# Patient Record
Sex: Female | Born: 1960 | Race: White | Hispanic: No | Marital: Married | Smoking: Never smoker
Health system: Southern US, Community
[De-identification: ages and names within clinical notes are randomized; demographics above are authoritative.]

## PROBLEM LIST (undated history)

## (undated) DIAGNOSIS — K219 Gastro-esophageal reflux disease without esophagitis: Secondary | ICD-10-CM

## (undated) DIAGNOSIS — F429 Obsessive-compulsive disorder, unspecified: Secondary | ICD-10-CM

## (undated) DIAGNOSIS — K760 Fatty (change of) liver, not elsewhere classified: Secondary | ICD-10-CM

## (undated) HISTORY — PX: CHOLECYSTECTOMY: SHX55

## (undated) HISTORY — PX: OVARIAN CYST REMOVAL: SHX89

## (undated) HISTORY — PX: APPENDECTOMY: SHX54

## (undated) HISTORY — PX: ABDOMINAL HYSTERECTOMY: SHX81

---

## 1998-03-07 ENCOUNTER — Ambulatory Visit (HOSPITAL_COMMUNITY): Admission: RE | Admit: 1998-03-07 | Discharge: 1998-03-07 | Payer: Self-pay | Admitting: Obstetrics and Gynecology

## 1999-09-22 ENCOUNTER — Ambulatory Visit (HOSPITAL_COMMUNITY): Admission: RE | Admit: 1999-09-22 | Discharge: 1999-09-22 | Payer: Self-pay | Admitting: Family Medicine

## 1999-09-22 ENCOUNTER — Encounter: Payer: Self-pay | Admitting: Family Medicine

## 1999-09-27 ENCOUNTER — Ambulatory Visit (HOSPITAL_COMMUNITY): Admission: RE | Admit: 1999-09-27 | Discharge: 1999-09-27 | Payer: Self-pay | Admitting: Family Medicine

## 1999-09-27 ENCOUNTER — Encounter: Payer: Self-pay | Admitting: Family Medicine

## 1999-09-29 ENCOUNTER — Emergency Department (HOSPITAL_COMMUNITY): Admission: EM | Admit: 1999-09-29 | Discharge: 1999-09-30 | Payer: Self-pay | Admitting: Emergency Medicine

## 1999-12-06 ENCOUNTER — Ambulatory Visit (HOSPITAL_COMMUNITY): Admission: RE | Admit: 1999-12-06 | Discharge: 1999-12-06 | Payer: Self-pay | Admitting: Gastroenterology

## 1999-12-06 ENCOUNTER — Encounter (INDEPENDENT_AMBULATORY_CARE_PROVIDER_SITE_OTHER): Payer: Self-pay | Admitting: Specialist

## 2002-07-05 ENCOUNTER — Encounter (INDEPENDENT_AMBULATORY_CARE_PROVIDER_SITE_OTHER): Payer: Self-pay | Admitting: Specialist

## 2002-07-06 ENCOUNTER — Inpatient Hospital Stay (HOSPITAL_COMMUNITY): Admission: AD | Admit: 2002-07-06 | Discharge: 2002-07-08 | Payer: Self-pay | Admitting: Obstetrics and Gynecology

## 2002-07-25 ENCOUNTER — Inpatient Hospital Stay (HOSPITAL_COMMUNITY): Admission: AD | Admit: 2002-07-25 | Discharge: 2002-07-25 | Payer: Self-pay | Admitting: *Deleted

## 2003-12-11 ENCOUNTER — Other Ambulatory Visit: Admission: RE | Admit: 2003-12-11 | Discharge: 2003-12-11 | Payer: Self-pay | Admitting: Obstetrics and Gynecology

## 2004-12-16 ENCOUNTER — Emergency Department (HOSPITAL_COMMUNITY): Admission: EM | Admit: 2004-12-16 | Discharge: 2004-12-16 | Payer: Self-pay | Admitting: Emergency Medicine

## 2004-12-27 ENCOUNTER — Ambulatory Visit: Payer: Self-pay | Admitting: Gastroenterology

## 2005-02-09 ENCOUNTER — Other Ambulatory Visit: Admission: RE | Admit: 2005-02-09 | Discharge: 2005-02-09 | Payer: Self-pay | Admitting: Obstetrics and Gynecology

## 2006-04-02 ENCOUNTER — Ambulatory Visit: Payer: Self-pay | Admitting: Family Medicine

## 2006-04-02 ENCOUNTER — Inpatient Hospital Stay (HOSPITAL_COMMUNITY): Admission: EM | Admit: 2006-04-02 | Discharge: 2006-04-04 | Payer: Self-pay | Admitting: Emergency Medicine

## 2006-04-11 ENCOUNTER — Ambulatory Visit: Payer: Self-pay | Admitting: Family Medicine

## 2006-08-10 ENCOUNTER — Ambulatory Visit: Payer: Self-pay | Admitting: Family Medicine

## 2006-09-14 DIAGNOSIS — K219 Gastro-esophageal reflux disease without esophagitis: Secondary | ICD-10-CM | POA: Insufficient documentation

## 2006-09-14 DIAGNOSIS — F429 Obsessive-compulsive disorder, unspecified: Secondary | ICD-10-CM | POA: Insufficient documentation

## 2006-11-10 ENCOUNTER — Telehealth (INDEPENDENT_AMBULATORY_CARE_PROVIDER_SITE_OTHER): Payer: Self-pay | Admitting: *Deleted

## 2006-11-10 ENCOUNTER — Ambulatory Visit: Payer: Self-pay | Admitting: Family Medicine

## 2006-11-10 DIAGNOSIS — R519 Headache, unspecified: Secondary | ICD-10-CM | POA: Insufficient documentation

## 2006-11-10 DIAGNOSIS — R51 Headache: Secondary | ICD-10-CM | POA: Insufficient documentation

## 2006-12-17 ENCOUNTER — Emergency Department (HOSPITAL_COMMUNITY): Admission: EM | Admit: 2006-12-17 | Discharge: 2006-12-17 | Payer: Self-pay | Admitting: *Deleted

## 2006-12-19 ENCOUNTER — Emergency Department (HOSPITAL_COMMUNITY): Admission: EM | Admit: 2006-12-19 | Discharge: 2006-12-19 | Payer: Self-pay | Admitting: Emergency Medicine

## 2007-03-09 ENCOUNTER — Telehealth: Payer: Self-pay | Admitting: *Deleted

## 2007-08-06 ENCOUNTER — Ambulatory Visit: Payer: Self-pay | Admitting: Family Medicine

## 2008-02-14 ENCOUNTER — Encounter: Admission: RE | Admit: 2008-02-14 | Discharge: 2008-02-14 | Payer: Self-pay | Admitting: Neurology
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
## 2010-12-03 NOTE — Discharge Summary (Signed)
NAME:  Kiara [name], Kiara [name] NO.:  [id number]   MEDICAL RECORD NO.:  [id number]          PATIENT TYPE:  INP   LOCATION:  3707                         FACILITY:  [facility]   PHYSICIAN:  Lupita Raider, M.D.   DATE OF BIRTH:  14-Mar-1961   DATE OF ADMISSION:  04/02/2006  DATE OF DISCHARGE:  04/04/2006                                 DISCHARGE SUMMARY   ATTENDING PHYSICIAN:  Dr. Oda Cogan of family practice.   PRIMARY CARE PHYSICIAN:  None.   CONSULTS:  Dr. Jacinto Halim of cardiology.   PROCEDURE:  Cardiac cath on September 18 that was totally normal with an  ejection fraction of 60%.   HISTORY OF PRESENT ILLNESS:  This is a 50 year old white female with  extensive family cardiac history (both parents with their first MIs less  than 50) who presented with substernal chest pressure, worsened with  exertion and relieved by rest and nitroglycerin with radiation to her jaw  bilaterally.  Some associated shortness of breath and dizziness.  She does  not smoke, but did work in a bar with heavy second-hand smoke x8 years.  No  cardiac history in herself.  Does have a history of GERD with an EGD and  colonoscopy in 2001 that were both normal.   ADMISSION MEDICATIONS:  1. Luvox 150 mg p.o. daily.  2. MiraLax 17 gm p.o. p.r.n. constipation.  3. Aspirin 81 mg p.o. periodically.   DISCHARGE MEDICATIONS:  1. Luvox 150 mg p.o. daily.  2. MiraLax 17 gm p.o. p.r.n. constipation.  3. Aspirin 81 mg p.o. daily.  Patient instructed to take this daily to      protect her heart.  4. Prilosec OTC 20 mg p.o. daily x14-day trial.   DISCHARGE DIAGNOSES:  1. Noncardiac exertional chest pressure (questionable esophageal spasm).  2. Obsessive-compulsive disorder.  3. Irritable bowel syndrome with constipation predominance.  4. History of GERD with normal EGD and colonoscopy in 2001 by Dr. Danise Edge.  5. Adenomyosis status post hysterectomy.  6. History of migraines.  7. History of  appendectomy.   DISPOSITION:  Patient was discharged home after her cath.  She had a clear  sensorium.  Her calf site dressing was clean, dry and intact.  She still had  chest pressure with exertion.  All vital signs were stable, however, and she  was eating very well.   FOLLOWUP:  Hospital followup with Dr. Oda Cogan on September 25 at 2:00  p.m. for followup on her chest pressure and possibly for referral to Dr.  Laural Benes for a GI consult.   LABS:  Cardiac enzymes were negative x3, she had a normal CBC and  chemistries, cholesterol looked great with a total cholesterol 173,  triglycerides 53, HDL 60, LDL 102.  Her TSH was 2.495.  D-dimer negative.  UDS negative.  Negative chest x-ray.  EKG with inverted T waves V1 and V2  without ST segment changes.  Also showed some left axis deviation.   HOSPITAL COURSE:  Given the patient's strong family history and symptoms  consistent with typical angina with nonspecific EKG  changes, patient was  admitted for MI rule out, she was started on Plavix, aspirin and heparin and  enzymes cycled that were negative x3.  Patient continued to have substernal  chest pressure so cardiology was consulted and felt a cath would be needed,  this was done on September 18 and did not show any coronary artery disease  or aortic stenosis or dissection.  The Plavix and heparin were D/C'd and the  patient was discharged on a trial of Prilosec OTC for question of GERD.  The  etiology of her chest pain given a negative cath is likely GI related  although it is somewhat atypical in that it is brought on with exertion.  I  wonder about any kind of component of anxiety.  I do wonder with her history  of GERD severe enough to warrant an EGD in 2001 if she may have some sort of  stricture now, especially in light of her endorsement of intermittent  dysphagia to solids.  She does not endorse any abnormal weight loss or gain.  I recommended she establish a primary care  physician here in the area and  follow up with Dr. Laural Benes who performed her EGD back in 2001 for further  workup of her GI issues, especially if her chest pressure continues.           ______________________________  Lupita Raider, M.D.     KS/MEDQ  D:  04/04/2006  T:  04/04/2006  Job:  [id number]

## 2010-12-03 NOTE — Op Note (Signed)
NAME:  Kiara [name], Kiara [name]                        ACCOUNT NO.:  [id number]   MEDICAL RECORD NO.:  [id number]                   PATIENT TYPE:  OBV   LOCATION:  9399                                 FACILITY:  [facility]   PHYSICIAN:  Juluis Mire, M.D.                DATE OF BIRTH:  1960/11/15   DATE OF PROCEDURE:  07/05/2002  DATE OF DISCHARGE:                                 OPERATIVE REPORT   PREOPERATIVE DIAGNOSES:  Adenomyosis.   POSTOPERATIVE DIAGNOSES:  Adenomyosis.   OPERATIVE PROCEDURE:  Laparoscopically assisted vaginal hysterectomy.   SURGEON:  Juluis Mire, M.D.   ASSISTANT:  Stann Mainland. Vincente Poli, M.D.   ESTIMATED BLOOD LOSS:  200 cc.   PACKS AND DRAINS:  None.   INTRAOPERATIVE BLOOD PLACED:  None.   COMPLICATIONS:  None.   INDICATIONS:  Noted in history and physical.   PROCEDURE AS FOLLOWS:  The patient taken to the OR and placed in supine  position.  After a satisfactory level of general endotracheal anesthesia was  obtained the patient was placed in the dorsal lithotomy position using Allen  stirrups.  The abdomen, perineum, and vagina were prepped out with Betadine.  Bladder was emptied by in-and-out catheterization.  A Hulka tenaculum was  put in place.  The patient was draped out for surgery.  A subumbilical  incision made with the knife.  The Veress needle was introduced in abdominal  cavity.  Abdomen was insufflated with approximately 4 L of carbon dioxide.  The operating laparoscope was introduced.  There was no evidence of injury  to adjacent organs.  A 5 mm trocar was put in the suprapubic area under  direct visualization.  Uterus was upper limits of normal size, slightly  irregular.  Tubes and ovaries unremarkable.  No active endometriosis noted.  The appendix was surgically absent.  Upper abdomen including liver and tip  of the gallbladder were clear.  Using the plasmakinetic tripolar we were  able to take down the right round ligament and adnexa as well  as the left  round ligament and utero-ovarian pedicle.  With this we had good hemostasis  release of the uterus.   The patient's abdomen was deflated of its carbon dioxide.  The patient's  legs were repositioned.  A weighted speculum was placed in the vaginal  vault.  The Hulka tenaculum then removed.  Cervix was grasped with a Christella Hartigan'  tenaculum.  Cul-de-sac was entered sharply.  Uterosacral ligaments were  clamped, cut, and suture ligated with 0 Vicryl.  Using the clamp, cut, and  tie technique with suture ligatures of 0 Vicryl, the parametrium was  serially separated from the sides of the uterus.  Vesicouterine space was  identified, entered sharply, and retractors put in place.  At this point the  uterus was flipped.  Remaining pedicles were clamped and cut.  Uterus passed  off the operative field.  Held pedicles secured  with free ties of 0 Vicryl.  Uterosacral plication stitch of 0 Vicryl put in place.  Vaginal mucosa  reapproximated with figure-of-eight of 0 Vicryl.  We had good hemostasis.  Urine output was clear and adequate after a Foley had been placed.  Sponge  on sponge stick was placed in the vaginal vault.  The patient's legs were  repositioned.   Abdomen was reinflated of its carbon dioxide.  Vaginal cuff was visualized  and irrigated.  Small areas of bleeding brought under control with the  plasmakinetic tripolar.  The ovarian adnexa were unremarkable.  At this  point in time the abdomen was deinflated of its carbon dioxide, all trocars  removed.  Subumbilical incision closed with interrupted subcuticulars of 4-0  Vicryl.  The suprapubic incision was closed with Steri-Strips.  The sponge  on sponge stick was removed from the vaginal vault.  Sponge, instrument,  needle count reported as correct by circulating nurse x2.  Foley catheter  remained clear at time of closure.  The patient when extubated was  transferred to recovery room in good condition.                                                Juluis Mire, M.D.    JSM/MEDQ  D:  07/05/2002  T:  07/05/2002  Job:  [id number]

## 2010-12-03 NOTE — Cardiovascular Report (Signed)
NAME:  Kiara, [name] NO.:  [id number]   MEDICAL RECORD NO.:  [id number]          PATIENT TYPE:  INP   LOCATION:  3707                         FACILITY:  [facility]   PHYSICIAN:  Cristy Hilts. Jacinto Halim, MD       DATE OF BIRTH:  March 15, 1961   DATE OF PROCEDURE:  04/04/2006  DATE OF DISCHARGE:                              CARDIAC CATHETERIZATION   OPERATION/PROCEDURE:  1. Left ventriculography.  2. Selective left coronary arteriography.  3. Ascending aortogram.  4. Abdominal aortogram.  5. Selective left renal artery and closure of the right femoral artery      access with StarClose.   INDICATIONS:  Kiara [name] is a 50 year old Caucasian female with a strong  family history of premature coronary artery disease and borderline  hyperlipidemia who was admitted to the hospital with chest pain suggestive  of unstable angina.  She was ruled out for myocardial infarction.  Because  of persistent chest discomfort and also mildly abnormal EKG in the form of T  wave inversion in the anterior leads and also chest pain relieved with  nitroglycerin, she was brought to the cardiac catheterization lab to  evaluate the coronary anatomy.   HEMODYNAMIC DATA:  Left ventricular pressure of 111/6  with an end diastolic  pressure 10 mmHg.  The aortic pressure was 106/71 with a mean of 87 mmHg.  There was no pressure gradient across the aortic valve.   Right coronary artery:  The right coronary artery is a large caliber vessel,  a dominant vessel, smooth and normal.   Left main:  Left main is a large caliber vessel, smooth and normal.   Circumflex. The circumflex is a moderate caliber vessel, gives origin to a  large OM-1.  It is smooth and normal.   Left anterior descending.  The LAD is a large caliber vessel. Given origin  to large diagonal 1 and several small diagonals.  It ends just after  wrapping around the apex.   ASCENDING AORTOGRAM:  The ascending aortogram performed for evaluation  of  aortic regurgitation and ascending aortic aneurysm.  Revealed normal  ascending aorta with three aortic valve cusps.  There was no evidence of  aortic dissection or aortic regurgitation.   ABDOMINAL AORTOGRAM:  Abdominal aortogram reveals the presence of two renal  arteries, one on right and one on left.  They were widely patent and normal.   A total of 100 mL of contrast was utilized for diagnostic angiography.   IMPRESSION:  Normal coronary arteries, right dominant circulation with  normal left ventricular systolic function.   RECOMMENDATIONS:  Evaluation for noncardiac cause for chest pain is  indicated, especially evaluation for esophageal spasm should be considered.  She will need continued primary prevention strategy for prevention of  coronary artery disease.  Can be discharged home from cardiac standpoint  with followup with primary care physician.   TECHNIQUE:  Using a 6-French right femoral artery access, a 6-French  multipurpose B-2 catheter was advanced to the ascending aorta with a 0.02  [place] J-wire.  The catheter was gently advanced to the left ventricle. Then  contrast injection of left ventricle was performed both in LAO and RAO  positions.  Catheter was flushed with saline and pulled back into the  ascending aorta.  Pressure gradient across the aorta was small.  The right  coronary artery was selectively engaged and angiography was performed.  Then  the left main coronary artery was selectively engaged and angiography was  performed.  Then the catheter was pulled back into the root of the aorta and  the ascending aortogram was performed.  Then the catheter was pulled back in  the abdominal aorta and abdominal aortogram was performed.  Then the  catheter was pulled out of the body in the usual fashion.  Right femoral  angiography was performed through the arterial access sheath and the access  was closed with  StarClose with excellent hemostasis.  The patient  tolerated  the procedure well.  No immediate complications noted.      Cristy Hilts. Jacinto Halim, MD  Electronically Signed     JRG/MEDQ  D:  04/04/2006  T:  04/05/2006  Job:  [id number]

## 2010-12-03 NOTE — H&P (Signed)
NAME:  Kiara [name], Kiara [name]                        ACCOUNT NO.:  [id number]   MEDICAL RECORD NO.:  [id number]                   PATIENT TYPE:  OBV   LOCATION:  9399                                 FACILITY:  [facility]   PHYSICIAN:  Juluis Mire, M.D.                DATE OF BIRTH:  1960/10/08   DATE OF ADMISSION:  07/05/2002  DATE OF DISCHARGE:                                HISTORY & PHYSICAL   HISTORY OF PRESENT ILLNESS:  The patient is a 50 year old, G3, P2, AB1,  married, white female who presents for laparoscopic-assisted vaginal  hysterectomy.  In relation to the present admission, the patient's cycles  are occurring approximately every two weeks.  She is having problems with  menstrual spotting.  With her flow, she has very heavy cycles and increasing  pain and discomfort.  This is becoming limiting from the patient's  standpoint.  She had a previous diagnostic laparoscopy and hysteroscopy done  in 1999, with findings of pelvic endometriosis and uterine adenomyosis.  We  have done a followup ultrasound that did reveal and apparent left ovarian  cyst.  This has decreased on follow up ultrasound.  The patient is unable to  tolerate hormonal management of her cycles with birth control pills and  because of significant discomfort and flow problems now present for  laparoscopic-assisted vaginal hysterectomy.   ALLERGIES:  SULFA, E-MYCIN and CODEINE.   MEDICATIONS:  Luvox 150 mg a day.   PAST MEDICAL HISTORY:  1. Obsessive compulsive disorder on medications as noted.  2. Usual childhood diseases.   PAST SURGICAL HISTORY:  1. Eye surgery done twice.  2. Cyst removed from her ovary once before.  3. Previous laparoscopy and hysteroscopy as noted.   PAST OBSTETRICAL HISTORY:  Two spontaneous vaginal deliveries and one  miscarriage.   FAMILY HISTORY:  Noncontributory.   SOCIAL HISTORY:  No tobacco or alcohol use.   REVIEW OF SYMPTOMS:  Noncontributory.   PHYSICAL EXAMINATION:   VITAL SIGNS:  Afebrile with stable vital signs.  HEENT:  The patient is normocephalic. Pupils equal round and reactive to  light and accommodation.  Extraocular movements intact.  Sclerae and  conjunctivae clear.  Oropharynx clear.  NECK:  Without thyromegaly.  BREASTS:  No discrete masses.  LUNGS:  Clear.  CARDIAC:  Regular rate and rhythm without murmur, rub or gallop.  ABDOMEN:  Benign with no masses, organomegaly or tenderness.  PELVIC:  Normal external genitalia.  Vaginal mucosa is clear.  Cervix is  unremarkable.  Uterus is normal size, shape and contrast.  Adnexa free of  masses.  EXTREMITIES:  Trace edema.  NEUROLOGIC:  Grossly within normal limits.   IMPRESSION:  Pelvic endometriosis, uterine adenomyosis leading to increasing  pelvic symptomatology.   PLAN:  After discussion of options, which included conservative followup  versus further attempts at hormonal management versus further conservative  therapy, the patient decided to proceed with definitive  therapy in the form  of laparoscopic-assisted vaginal hysterectomy.  The risks of surgery have  been discussed including the risk of infection, the risk of hemorrhage  necessitating transfusion with the risk of AIDS or hepatitis, risk of injury  to adjacent organs including bladder, bowel or ureter and the risk of deep  venous thrombosis and pulmonary embolus.  The patient expressed  understanding of indications and risks.                                               Juluis Mire, M.D.    JSM/MEDQ  D:  07/05/2002  T:  07/05/2002  Job:  [id number]

## 2010-12-03 NOTE — H&P (Signed)
NAME:  Kiara [name], Kiara [name] NO.:  [id number]   MEDICAL RECORD NO.:  [id number]          PATIENT TYPE:  INP   LOCATION:  3707                         FACILITY:  [facility]   PHYSICIAN:  Lupita Raider, M.D.   DATE OF BIRTH:  June 12, 1961   DATE OF ADMISSION:  04/02/2006  DATE OF DISCHARGE:                                HISTORY & PHYSICAL   PRIMARY CARE PHYSICIAN:  None.   OB-GYN PHYSICIAN:  Juluis Mire, M.D.   CHIEF COMPLAINT:  Substernal chest pain for rule out myocardial infarction.   HISTORY OF PRESENT ILLNESS:  This is a 50 year old white female with an  extensive family history of cardiac disease (mother had a myocardial  infarction at 51 and father had myocardial infarction at 3) and obsessive  compulsive disorder in herself who presents with a day-long history of a  complaint of chest pressure.  It began this morning substernally located  with associated shortness of breath brought on by exertion and relieved by  rest and being totally supine.  It was associated with some mild  lightheadedness but no loss of consciousness.  She was seen at [facility]  and given nitroglycerin x 1 with relief completely of the chest pressure  from 6/10 to 0/10.  The relief lasted approximately six minutes and then  slowly returned.  Her EKG showed some nonspecific T-wave changes and left  axis deviation, so she was sent here for further evaluation.  EMS repeated  the nitroglycerin x 1, again with full relief.  She also complained to the  EMS crew of mild jaw pain that tasted like metal and has since resolved.  Nitroglycerin x 1 (this is her third dose) in the emergency department with  morphine has added up to give her relief of 2/10 currently.  She does report  a one month history of intermittent daily palpitations and a three to four  month history of decreased energy with questionable increase of dyspnea on  exertion with extreme walking.  No cardiac history in herself.  She  does not  have a primary care physician.  She is only seen by ob-gyn annually.   REVIEW OF SYSTEMS:  Other than some chronic constipation secondary to  irritable bowel syndrome, she denies any other review of systems including  weight changes, loss of consciousness, dry skin or hair changes or heat or  cold intolerance.   PAST MEDICAL HISTORY:  1. Adenomyosis, status post laparoscopic hysterectomy vaginally in 2003.  2. Pelvic endometriosis.  3. History of obsessive-compulsive disorder.  4. History of gastroesophageal reflux disease, status post an      esophagogastroduodenoscopy in May of 2001 that was normal with normal      flexible sigmoidoscopy.  5. History of migraines.  6. History of multiple eye surgeries (1965 and 1966).  7. Appendectomy.  8. Diagnosis of irritable bowel syndrome in 2001 with chronic      constipation.   ALLERGIES:  SULFA, ERYTHROMYCIN AND CODEINE.   HOME MEDICATIONS:  1. 81 mg aspirin p.o. daily.  2. Luvox 150 mg by mouth q. p.m.  3. MiraLax  17 g by mouth daily.   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  She uses social alcohol only and denies  all illicit drug use.  She lives at home with her husband and son.   FAMILY HISTORY:  Mother had her first myocardial infarction of four at the  age of 61 and subsequently died from lung cancer. Her father had a  myocardial infarction at 65 and is still living but is status post two  stents.  All four of her grandparents were deceased in their 49s secondary  to myocardial infarctions.   PHYSICAL EXAMINATION:  VITAL SIGNS:  Temperature 98.2, heart rate 61 to 85,  respiratory rate 20, blood pressure 95 to 112/55 to 60.  98 to 100% oxygen  saturations on room air.  GENERAL:  This is a pleasant and conversant, awake and alert white female  who appears her stated age in no apparent distress.  HEENT:  Moist mucous membranes.  Pupils equal, round  and reactive to light  and accommodation.  Extraocular movements are intact.  NECK:  Supple, no jugular venous distention or lymphadenopathy or thyroid  enlargement.  CARDIOVASCULAR:  Regular rate and rhythm, no murmurs, rubs or gallops.  PULMONARY:  Clear to auscultation bilaterally without wheezes, rhonchi or  crackles and no work of breathing.  ABDOMEN:  Positive bowel sounds, soft, nontender, nondistended, no  hepatosplenomegaly.  EXTREMITIES:  2+ pulses in all extremities.  No edema.  Brisk capillary  refill in fingertips.  SKIN:  Warm and dry.  Good skin turgor, no lesions.  NEUROLOGIC:  Cranial nerves 2 through 12 are intact.  Strength 5/5 in all  extremities, normal gait.   LABORATORY DATA:  CBC and chemistries totally within normal limits.  CBG  with a pH of 7.476, pCO2 of 32.5 and a bicarb of 23.9.  D-dimer of 0.23.  Urinalysis was negative.  Point of care enzymes x 3 were negative.  Chest x-  ray was negative for any acute pulmonary disease and EKG  did show inverted  T-waves in V1 and V2 but no ST segment changes and was positive for left  axis deviation.   ASSESSMENT AND PLAN:  The patient is a 50 year old white female with an  extensive family history of cardiac disease now presenting with a one day  history of typical chest pain (substernal brought on by exertion, relieved  by rest and nitro) with negative cardiac enzymes x 3 and nonspecific EKG  changes.  1. Chest pressure.  This is typical angina by definition.  A strong family      history and eight years of smoking exposure in her obese body habitus      are her major risk factors.  Her lipids are unknown.  She has no know      history of hypertension.  A cardiac source is a real possibility for      this chest pressure.  We will hold on heparin for now, given her mostly      normal EKG (no ST segment changes) and point of care enzymes that have     been negative x 3.  Will start aspirin 81 mg and  nitroglycerin/morphine      p.r.n.  A beta blocker would be useful, but currently given her heart      rate in the 50s and her lowest blood pressure will hold for now.  Will      follow cardiac enzymes x 2 eight hours apart and repeat an EKG in the      a.m.  If her enzymes are abnormal will cardiology and start heparin.      Also check fasting lipid in the a.m. for risk stratification.  Check a      urine drug screen.  Other causes to consider; (1) thyroid.  Given      history of palpitations, will check her TSH; (2) pulmonary embolus but      with a negative D-dimer, no tachypnea or tachycardia and no hypoxia,      this is unlikely; (3) gastroesophageal reflux disease on Protonix; (4)      anxiety in light of her obsessive-compulsive disorder diagnosis and      parental myocardial infarctions in their 40s.  2. Respiratory alkalosis.  The patient is not tachypneic now but I wonder      if she is intermittently secondary to anxiety.  No hypoxia currently      with a normal bicarb.  She is clinically stable.  Will follow      clinically.  Check a BMET in the a.m. to ensure bicarb is okay.  3. Left axis deviation on EKG.  She does not have a murmur on exam. Her      vitals are stable now.  Will follow her cardiac enzymes.  If negative,      this can be worked up with an echocardiogram as an outpatient.  4. Obsessive-compulsive disorder.  Continue Luvox.  5. Prophylaxis Protonix and SCD's.  6. FENGI.  Half normal saline with 20 of KCl per liter at 75 ml per hour x      10 hours until she taking good p.o., regular diet.   DISPOSITION:  Observation for now for cardiac enzymes.  Further treatment  will depend on these (i.e. cardiology consult and heparin if positive versus  outpatient cardiology if negative.           ______________________________  Lupita Raider, M.D.     KS/MEDQ  D:  04/03/2006  T:  04/03/2006  Job:  [id number]

## 2010-12-03 NOTE — Discharge Summary (Signed)
   NAME:  Kiara [name], Kiara [name]                        ACCOUNT NO.:  [id number]   MEDICAL RECORD NO.:  [id number]                   PATIENT TYPE:  INP   LOCATION:  9125                                 FACILITY:  [facility]   PHYSICIAN:  Juluis Mire, M.D.                DATE OF BIRTH:  Nov 18, 1960   DATE OF ADMISSION:  07/05/2002  DATE OF DISCHARGE:  07/08/2002                                 DISCHARGE SUMMARY   ADMITTING DIAGNOSES:  Adenomyosis.   DISCHARGE DIAGNOSES:  Adenomyosis, pathology pending.   OPERATIVE PROCEDURE:  Laparoscopic assisted vaginal hysterectomy.   HISTORY OF PRESENT ILLNESS:  For complete history and physical, please see  dictated note.   HOSPITAL COURSE:  The patient underwent laparoscopic assisted vaginal  hysterectomy without problems.  Pathology is still pending at the present  time.  That evening after surgery she did spike a fever.  Temperature went  up to 101.7.  She felt achy.  Her examination was unremarkable except for a  few basilar rales.  We felt that it may have been atelectasis versus a viral  syndrome.  Her hemoglobin postoperative was 11.4, white count 9600.  She was  begun on IV Ancef and observed.  Her temperature defervesced without any  further issues.  We observed her through her second postoperative day to  make sure the fever had resolved.  She remained afebrile and asymptomatic.  On her third postoperative day she was afebrile with stable vital signs.  She was tolerating a regular diet and ambulating without difficulty.  Abdomen was soft and nontender.  Bowel sounds were active.  She was passing  flatus.  Incisions were clear.  She voided without difficulty and had no  active vaginal bleeding.   In terms of complications, none were encountered during stay in hospital.  The patient discharged home in stable condition.   DISPOSITION:  Routine postoperative instructions and orders given.  She is  to watch for signs of infection with rising  fever, nausea, vomiting,  increasing abdominal pain, or active vaginal bleeding.  She is to avoid  heavy lifting, vaginal entrance, or driving a care.  Follow-up in the office  will be in one week.  Tylox if needed for pain.                                               Juluis Mire, M.D.    JSM/MEDQ  D:  07/08/2002  T:  07/08/2002  Job:  [id number]

## 2011-05-05 LAB — I-STAT 8, (EC8 V) (CONVERTED LAB)
Chloride: 104
Glucose, Bld: 89
Potassium: 3.8
pH, Ven: 7.389 — ABNORMAL HIGH

## 2011-05-05 LAB — CBC
HCT: 41.7
MCHC: 33.6
MCV: 85.7
Platelets: 218
WBC: 6.8

## 2011-05-05 LAB — DIFFERENTIAL
Eosinophils Absolute: 0.1
Eosinophils Relative: 2
Lymphs Abs: 3
Monocytes Relative: 9

## 2011-05-05 LAB — URINALYSIS, ROUTINE W REFLEX MICROSCOPIC
Hgb urine dipstick: NEGATIVE
Nitrite: NEGATIVE
Protein, ur: NEGATIVE
Urobilinogen, UA: 0.2

## 2011-05-05 LAB — PREGNANCY, URINE: Preg Test, Ur: NEGATIVE

## 2011-05-05 LAB — URINE MICROSCOPIC-ADD ON

## 2012-12-15 ENCOUNTER — Encounter (HOSPITAL_BASED_OUTPATIENT_CLINIC_OR_DEPARTMENT_OTHER): Payer: Self-pay

## 2012-12-15 ENCOUNTER — Emergency Department (HOSPITAL_BASED_OUTPATIENT_CLINIC_OR_DEPARTMENT_OTHER)
Admission: EM | Admit: 2012-12-15 | Discharge: 2012-12-15 | Disposition: A | Discharge status: Home / Self Care | Payer: BC Managed Care – PPO | Attending: Emergency Medicine | Admitting: Emergency Medicine

## 2012-12-15 ENCOUNTER — Emergency Department (HOSPITAL_BASED_OUTPATIENT_CLINIC_OR_DEPARTMENT_OTHER): Payer: BC Managed Care – PPO

## 2012-12-15 DIAGNOSIS — R0602 Shortness of breath: Secondary | ICD-10-CM | POA: Insufficient documentation

## 2012-12-15 DIAGNOSIS — F419 Anxiety disorder, unspecified: Secondary | ICD-10-CM

## 2012-12-15 DIAGNOSIS — Z8719 Personal history of other diseases of the digestive system: Secondary | ICD-10-CM | POA: Insufficient documentation

## 2012-12-15 DIAGNOSIS — R0789 Other chest pain: Secondary | ICD-10-CM

## 2012-12-15 DIAGNOSIS — Z79899 Other long term (current) drug therapy: Secondary | ICD-10-CM | POA: Insufficient documentation

## 2012-12-15 DIAGNOSIS — F429 Obsessive-compulsive disorder, unspecified: Secondary | ICD-10-CM | POA: Insufficient documentation

## 2012-12-15 DIAGNOSIS — F411 Generalized anxiety disorder: Secondary | ICD-10-CM | POA: Insufficient documentation

## 2012-12-15 HISTORY — DX: Gastro-esophageal reflux disease without esophagitis: K21.9

## 2012-12-15 HISTORY — DX: Obsessive-compulsive disorder, unspecified: F42.9

## 2012-12-15 LAB — COMPREHENSIVE METABOLIC PANEL
AST: 30 U/L (ref 0–37)
BUN: 19 mg/dL (ref 6–23)
CO2: 27 mEq/L (ref 19–32)
Chloride: 104 mEq/L (ref 96–112)
Creatinine, Ser: 0.7 mg/dL (ref 0.50–1.10)
GFR calc non Af Amer: >90 mL/min (ref >90)
Total Bilirubin: 0.3 mg/dL (ref 0.3–1.2)

## 2012-12-15 LAB — CBC WITH DIFFERENTIAL/PLATELET
Basophils Absolute: 0 10*3/uL (ref 0.0–0.1)
HCT: 39 % (ref 36.0–46.0)
Hemoglobin: 13.3 g/dL (ref 12.0–15.0)
Lymphocytes Relative: 40 % (ref 12–46)
Monocytes Absolute: 0.6 10*3/uL (ref 0.1–1.0)
Monocytes Relative: 9 % (ref 3–12)
Neutro Abs: 3.3 10*3/uL (ref 1.7–7.7)
RBC: 4.49 MIL/uL (ref 3.87–5.11)
WBC: 6.6 10*3/uL (ref 4.0–10.5)

## 2012-12-15 MED ORDER — ALPRAZOLAM 0.25 MG PO TABS: 0.2500 mg | ORAL_TABLET | Freq: Three times a day (TID) | ORAL | Status: DC | PRN

## 2012-12-15 NOTE — ED Notes (Signed)
Pt states that she has anxiety, chest pain, and shortness of breath since 5/29 at about 0800.  Pt states that she continues to feel "anxious" and she cannot seem to shake that.

## 2012-12-15 NOTE — ED Provider Notes (Signed)
History  This chart was scribed for Geoffery Lyons, MD by Ardelia Mems, ED Scribe. This patient was seen in room MH12/MH12 and the patient's care was started at 6:16 PM.   CSN: [id number]  Arrival date & time 12/15/12  1740     Chief Complaint  Patient presents with  . Chest Pain     The history is provided by the patient. No language interpreter was used.    HPI Comments: Kiara [name] is a 52 y.o. female who presents to the Emergency Department complaining of constant, mild chest pain of 2 days duration. Pt reports also having anxiety. She describes chest pain as discomfort. Pt reports having mild SOB which she contributes to seasonal allergies and not the chest pain. Pt states that she has been having anxiety recently and reports that her doctor just started her on Abiliify. Pt reports one similar less severe episode of current symptoms in 2007, when she had a normal heart cath. Pt states that she works constantly. Pt states that mother and father both had MIs in their early 38's. Pt denies smoking and alcohol use.  Past Medical History  Diagnosis Date  . GERD (gastroesophageal reflux disease)   . OCD (obsessive compulsive disorder)     Past Surgical History  Procedure Laterality Date  . Appendectomy    . Ovarian cyst removal    . Abdominal hysterectomy    . Cholecystectomy      History reviewed. No pertinent family history.  History  Substance Use Topics  . Smoking status: Never Smoker   . Smokeless tobacco: Never Used  . Alcohol Use: No    OB History   Grav Para Term Preterm Abortions TAB SAB Ect Mult Living                  Review of Systems  All other systems reviewed and are negative.    Allergies  Erythromycin and Sulfamethoxazole  Home Medications   Current Outpatient Rx  Name  Route  Sig  Dispense  Refill  . ARIPiprazole (ABILIFY) 5 MG tablet   Oral   Take 2.5 mg by mouth daily.         . fluvoxaMINE (LUVOX) 100 MG tablet   Oral   Take  300 mg by mouth daily. 1 1/2 tablet by mouth once a day         . levothyroxine (SYNTHROID, LEVOTHROID) 25 MCG tablet   Oral   Take 25 mcg by mouth daily before breakfast.           Triage Vitals: BP 153/88  Pulse 81  Temp(Src) 98.5 F (36.9 C) (Oral)  Resp 16  Ht 5\' 8"  (1.727 m)  Wt 180 lb (81.647 kg)  BMI 27.38 kg/m2  SpO2 96%  Physical Exam  Nursing note and vitals reviewed. Constitutional: She is oriented to person, place, and time. She appears well-developed and well-nourished.  She appears slightly anxious.  HENT:  Head: Normocephalic and atraumatic.  Eyes: EOM are normal. Pupils are equal, round, and reactive to light.  Neck: Normal range of motion. No tracheal deviation present.  Cardiovascular: Normal rate, regular rhythm and normal heart sounds.   No murmur heard. Pulmonary/Chest: Effort normal and breath sounds normal. No respiratory distress.  Abdominal: Soft. There is no tenderness.  Musculoskeletal: Normal range of motion. She exhibits no tenderness.  Neurological: She is alert and oriented to person, place, and time.  Skin: Skin is warm. No rash noted.  Psychiatric: She has  a normal mood and affect.    ED Course  Procedures (including critical care time)  DIAGNOSTIC STUDIES: Oxygen Saturation is 96% on RA, adequate by my interpretation.    COORDINATION OF CARE: 6:20 PM- Pt advised of plan for treatment and pt agrees.     Labs Reviewed  COMPREHENSIVE METABOLIC PANEL - Abnormal; Notable for the following:    ALT 49 (*)    All other components within normal limits  CBC WITH DIFFERENTIAL  TROPONIN I   Dg Chest 2 View  12/15/2012   *RADIOLOGY REPORT*  Clinical Data: Chest pain.  Dizziness.  Shortness of breath.  CHEST - 2 VIEW  Comparison:  12/17/2006  Findings:  The heart size and mediastinal contours are within normal limits.  Both lungs are clear.  The visualized skeletal structures are unremarkable.  IMPRESSION: No active cardiopulmonary  disease.   Original Report Authenticated By: Myles Rosenthal, M.D.     No diagnosis found.   Date: 12/16/2012  Rate: 74  Rhythm: normal sinus rhythm  QRS Axis: left  Intervals: normal  ST/T Wave abnormalities: normal  Conduction Disutrbances:none  Narrative Interpretation:   Old EKG Reviewed: unchanged    MDM  The patient presents with anxiety-like symptoms, tightness in the chest for the past two days.  The workup is unremarkable, including ekg, troponin, and chest xray, and she has no risk factors.  I suspect an anxiety etiology.  Her symptoms are atypical and she reports recently starting abilify for psychiatric reasons that has made her feel bad.           I personally performed the services described in this documentation, which was scribed in my presence. The recorded information has been reviewed and is accurate.      Geoffery Lyons, MD 12/16/12 [phone]

## 2014-02-19 ENCOUNTER — Other Ambulatory Visit: Payer: Self-pay | Admitting: Dermatology

## 2014-07-19 ENCOUNTER — Encounter (HOSPITAL_BASED_OUTPATIENT_CLINIC_OR_DEPARTMENT_OTHER): Payer: Self-pay | Admitting: Emergency Medicine

## 2014-07-19 ENCOUNTER — Inpatient Hospital Stay (HOSPITAL_BASED_OUTPATIENT_CLINIC_OR_DEPARTMENT_OTHER)
Admission: EM | Admit: 2014-07-19 | Discharge: 2014-07-20 | DRG: 313 | Disposition: A | Discharge status: Home / Self Care | Payer: BC Managed Care – PPO | Attending: Internal Medicine | Admitting: Internal Medicine

## 2014-07-19 ENCOUNTER — Emergency Department (HOSPITAL_BASED_OUTPATIENT_CLINIC_OR_DEPARTMENT_OTHER): Payer: BC Managed Care – PPO

## 2014-07-19 DIAGNOSIS — Z882 Allergy status to sulfonamides status: Secondary | ICD-10-CM

## 2014-07-19 DIAGNOSIS — Z881 Allergy status to other antibiotic agents status: Secondary | ICD-10-CM

## 2014-07-19 DIAGNOSIS — Z79899 Other long term (current) drug therapy: Secondary | ICD-10-CM

## 2014-07-19 DIAGNOSIS — E039 Hypothyroidism, unspecified: Secondary | ICD-10-CM | POA: Diagnosis present

## 2014-07-19 DIAGNOSIS — K219 Gastro-esophageal reflux disease without esophagitis: Secondary | ICD-10-CM | POA: Diagnosis present

## 2014-07-19 DIAGNOSIS — F429 Obsessive-compulsive disorder, unspecified: Secondary | ICD-10-CM | POA: Diagnosis present

## 2014-07-19 DIAGNOSIS — R079 Chest pain, unspecified: Principal | ICD-10-CM | POA: Diagnosis present

## 2014-07-19 DIAGNOSIS — F42 Obsessive-compulsive disorder: Secondary | ICD-10-CM | POA: Diagnosis present

## 2014-07-19 DIAGNOSIS — Z90710 Acquired absence of both cervix and uterus: Secondary | ICD-10-CM

## 2014-07-19 DIAGNOSIS — R42 Dizziness and giddiness: Secondary | ICD-10-CM

## 2014-07-19 LAB — COMPREHENSIVE METABOLIC PANEL
ALK PHOS: 88 U/L (ref 39–117)
ALT: 44 U/L — ABNORMAL HIGH (ref 0–35)
ANION GAP: 7 (ref 5–15)
AST: 41 U/L — ABNORMAL HIGH (ref 0–37)
Albumin: 4.2 g/dL (ref 3.5–5.2)
BILIRUBIN TOTAL: 0.9 mg/dL (ref 0.3–1.2)
BUN: 14 mg/dL (ref 6–23)
CHLORIDE: 103 meq/L (ref 96–112)
CO2: 26 mmol/L (ref 19–32)
CREATININE: 0.74 mg/dL (ref 0.50–1.10)
Calcium: 9 mg/dL (ref 8.4–10.5)
GLUCOSE: 97 mg/dL (ref 70–99)
POTASSIUM: 4.1 mmol/L (ref 3.5–5.1)
Sodium: 136 mmol/L (ref 135–145)
Total Protein: 6.8 g/dL (ref 6.0–8.3)

## 2014-07-19 LAB — CBC WITH DIFFERENTIAL/PLATELET
Basophils Absolute: 0 10*3/uL (ref 0.0–0.1)
Basophils Relative: 0 % (ref 0–1)
Eosinophils Absolute: 0.1 10*3/uL (ref 0.0–0.7)
Eosinophils Relative: 2 % (ref 0–5)
HEMATOCRIT: 39.7 % (ref 36.0–46.0)
HEMOGLOBIN: 13.2 g/dL (ref 12.0–15.0)
LYMPHS ABS: 3 10*3/uL (ref 0.7–4.0)
Lymphocytes Relative: 41 % (ref 12–46)
MCH: 28.9 pg (ref 26.0–34.0)
MCHC: 33.2 g/dL (ref 30.0–36.0)
MCV: 87.1 fL (ref 78.0–100.0)
MONO ABS: 0.6 10*3/uL (ref 0.1–1.0)
MONOS PCT: 9 % (ref 3–12)
NEUTROS ABS: 3.5 10*3/uL (ref 1.7–7.7)
NEUTROS PCT: 48 % (ref 43–77)
Platelets: 201 10*3/uL (ref 150–400)
RBC: 4.56 MIL/uL (ref 3.87–5.11)
RDW: 12.6 % (ref 11.5–15.5)
WBC: 7.3 10*3/uL (ref 4.0–10.5)

## 2014-07-19 LAB — TROPONIN I

## 2014-07-19 LAB — LIPASE, BLOOD: Lipase: 45 U/L (ref 11–59)

## 2014-07-19 MED ORDER — ASPIRIN 81 MG PO CHEW
324.0000 mg | CHEWABLE_TABLET | Freq: Once | ORAL | Status: AC
  Administered 2014-07-19: 324 mg via ORAL
  Filled 2014-07-19: qty 4

## 2014-07-19 NOTE — ED Notes (Signed)
Pt reports pain radiating across chest to neck and jaw

## 2014-07-19 NOTE — ED Provider Notes (Signed)
CSN: [id number]     Arrival date & time 07/19/14  2219 History  This chart was scribed for Kiara Gaskins, MD by Modena Jansky, ED Scribe. This patient was seen in room MH04/MH04 and the patient's care was started at 11:39 PM.   Chief Complaint  Patient presents with  . Chest Pain   Patient is a 54 y.o. female presenting with chest pain. The history is provided by the patient. No language interpreter was used.  Chest Pain Pain quality comment:  Heavy Pain radiates to:  L jaw, R jaw, upper back and neck Pain radiates to the back: yes   Pain severity:  Moderate Duration:  3 days Timing:  Intermittent Progression:  Worsening Chronicity:  Recurrent Relieved by:  None tried Worsened by:  Exertion Ineffective treatments:  None tried Associated symptoms: dizziness, headache, shortness of breath and weakness   Associated symptoms: no abdominal pain, no cough, no fever, no numbness and not vomiting    HPI Comments: Kiara [name] is a 54 y.o. female who presents to the Emergency Department complaining of intermittent moderate chest pain that started a couple of day ago. She reports that this pain has been going on intermittently for a while, but has recently worsened. She states that the pain radiates to her jaw, upper back, and neck. She states that the episodes of pain last 5-10 minutes. She describes the pain as a heavy sensation. She reports that activity exacerbates the pain. She reports that when the pain starts she has SOB, weakness, and dizziness. She states that one episode she had a headache, and another she almost passed out. She reports that she has a family hx of heart disease. She denies any fever, vomiting, cough, abdominal pain, numbness, or leg swelling.   Fam hx - premature CAD   Past Medical History  Diagnosis Date  . GERD (gastroesophageal reflux disease)   . OCD (obsessive compulsive disorder)    Past Surgical History  Procedure Laterality Date  . Appendectomy    .  Ovarian cyst removal    . Abdominal hysterectomy    . Cholecystectomy     History reviewed. No pertinent family history. History  Substance Use Topics  . Smoking status: Never Smoker   . Smokeless tobacco: Never Used  . Alcohol Use: No   OB History    No data available     Review of Systems  Constitutional: Negative for fever.  Respiratory: Positive for shortness of breath. Negative for cough.   Cardiovascular: Positive for chest pain. Negative for leg swelling.  Gastrointestinal: Negative for vomiting and abdominal pain.  Neurological: Positive for dizziness, weakness and headaches. Negative for numbness.  All other systems reviewed and are negative.   Allergies  Erythromycin and Sulfamethoxazole  Home Medications   Prior to Admission medications   Medication Sig Start Date End Date Taking? Authorizing Provider  ALPRAZolam (XANAX) 0.25 MG tablet Take 1 tablet (0.25 mg total) by mouth 3 (three) times daily as needed for sleep. 12/15/12   Geoffery Lyons, MD  ARIPiprazole (ABILIFY) 5 MG tablet Take 2.5 mg by mouth daily.    Historical Provider, MD  fluvoxaMINE (LUVOX) 100 MG tablet Take 300 mg by mouth daily. 1 1/2 tablet by mouth once a day    Historical Provider, MD  levothyroxine (SYNTHROID, LEVOTHROID) 25 MCG tablet Take 25 mcg by mouth daily before breakfast.    Historical Provider, MD   BP 146/76 mmHg  Pulse 75  Temp(Src) 97.6 F (36.4 C) (  Oral)  Resp 20  Ht  (1.727 m)  Wt 180 lb (81.647 kg)  BMI 27.38 kg/m2  SpO2 100% Physical Exam  Nursing note and vitals reviewed. CONSTITUTIONAL: Well developed/well nourished HEAD: Normocephalic/atraumatic EYES: EOMI/PERRL ENMT: Mucous membranes moist NECK: supple no meningeal signs SPINE/BACK:entire spine nontender CV: S1/S2 noted, no murmurs/rubs/gallops noted LUNGS: Lungs are clear to auscultation bilaterally, no apparent distress ABDOMEN: soft, nontender, no rebound or guarding, bowel sounds noted throughout  abdomen GU:no cva tenderness NEURO: Pt is awake/alert/appropriate, moves all extremitiesx4.  No facial droop.   EXTREMITIES: pulses normal/equal, full ROM SKIN: warm, color normal PSYCH: no abnormalities of mood noted, alert and oriented to situation  ED Course  Procedures  DIAGNOSTIC STUDIES: Oxygen Saturation is 100% on RA, normal by my interpretation.    COORDINATION OF CARE: 11:43 PM- Pt advised of plan for treatment which includes medication, radiology, and labs and pt agrees.  12:36 AM PT CP free at this time She has concerning story for possible ACS Will admit D/w dr Clyde Lundborg, [facility] hospital  Labs Review Labs Reviewed  COMPREHENSIVE METABOLIC PANEL - Abnormal; Notable for the following:    AST 41 (*)    ALT 44 (*)    All other components within normal limits  CBC WITH DIFFERENTIAL  TROPONIN I  LIPASE, BLOOD    Imaging Review Dg Chest 2 View  07/19/2014   CLINICAL DATA:  Pain radiating across the Chest to the neck and jaw all day.  EXAM: CHEST  2 VIEW  COMPARISON:  12/15/2012  FINDINGS: The heart size and mediastinal contours are within normal limits. Both lungs are clear. The visualized skeletal structures are unremarkable.  IMPRESSION: No active cardiopulmonary disease.   Electronically Signed   By: Burman Nieves M.D.   On: 07/19/2014 23:33     EKG Interpretation   Date/Time:  Saturday July 19 2014 22:27:38 EST Ventricular Rate:  79 PR Interval:  132 QRS Duration: 80 QT Interval:  384 QTC Calculation: 440 R Axis:   -41 Text Interpretation:  Normal sinus rhythm with sinus arrhythmia Left axis  deviation Possible Anterolateral infarct , age undetermined Abnormal ECG  significant artifact, needs repeat Confirmed by Bebe Shaggy  MD, Shelvia Fojtik  [phone]) on 07/19/2014 11:38:24 PM     Medications  nitroGLYCERIN (NITROGLYN) 2 % ointment 1 inch (not administered)  aspirin chewable tablet 324 mg (324 mg Oral Given 07/19/14 2347)    MDM   Final  diagnoses:  Chest pain  Chest pain, rule out acute myocardial infarction    Nursing notes including past medical history and social history reviewed and considered in documentation xrays/imaging reviewed by myself and considered during evaluation Labs/vital reviewed myself and considered during evaluation Previous records reviewed and considered   I personally performed the services described in this documentation, which was scribed in my presence. The recorded information has been reviewed and is accurate.       Kiara Gaskins, MD 07/20/14 [phone]

## 2014-07-20 ENCOUNTER — Other Ambulatory Visit: Payer: Self-pay

## 2014-07-20 ENCOUNTER — Inpatient Hospital Stay (HOSPITAL_COMMUNITY): Payer: BC Managed Care – PPO

## 2014-07-20 ENCOUNTER — Encounter (HOSPITAL_COMMUNITY): Payer: Self-pay | Admitting: *Deleted

## 2014-07-20 DIAGNOSIS — R0789 Other chest pain: Secondary | ICD-10-CM

## 2014-07-20 DIAGNOSIS — Z881 Allergy status to other antibiotic agents status: Secondary | ICD-10-CM | POA: Diagnosis not present

## 2014-07-20 DIAGNOSIS — R079 Chest pain, unspecified: Secondary | ICD-10-CM | POA: Diagnosis present

## 2014-07-20 DIAGNOSIS — E039 Hypothyroidism, unspecified: Secondary | ICD-10-CM | POA: Diagnosis present

## 2014-07-20 DIAGNOSIS — K210 Gastro-esophageal reflux disease with esophagitis: Secondary | ICD-10-CM

## 2014-07-20 DIAGNOSIS — Z90710 Acquired absence of both cervix and uterus: Secondary | ICD-10-CM | POA: Diagnosis not present

## 2014-07-20 DIAGNOSIS — K219 Gastro-esophageal reflux disease without esophagitis: Secondary | ICD-10-CM | POA: Diagnosis not present

## 2014-07-20 DIAGNOSIS — Z79899 Other long term (current) drug therapy: Secondary | ICD-10-CM | POA: Diagnosis not present

## 2014-07-20 DIAGNOSIS — F42 Obsessive-compulsive disorder: Secondary | ICD-10-CM

## 2014-07-20 DIAGNOSIS — Z882 Allergy status to sulfonamides status: Secondary | ICD-10-CM | POA: Diagnosis not present

## 2014-07-20 LAB — CBC
HEMATOCRIT: 39.7 % (ref 36.0–46.0)
Hemoglobin: 12.9 g/dL (ref 12.0–15.0)
MCH: 28.1 pg (ref 26.0–34.0)
MCHC: 32.5 g/dL (ref 30.0–36.0)
MCV: 86.5 fL (ref 78.0–100.0)
Platelets: 176 10*3/uL (ref 150–400)
RBC: 4.59 MIL/uL (ref 3.87–5.11)
RDW: 12.8 % (ref 11.5–15.5)
WBC: 6.5 10*3/uL (ref 4.0–10.5)

## 2014-07-20 LAB — BASIC METABOLIC PANEL
ANION GAP: 4 — AB (ref 5–15)
BUN: 9 mg/dL (ref 6–23)
CHLORIDE: 106 meq/L (ref 96–112)
CO2: 29 mmol/L (ref 19–32)
Calcium: 9.2 mg/dL (ref 8.4–10.5)
Creatinine, Ser: 0.69 mg/dL (ref 0.50–1.10)
GFR calc non Af Amer: >90 mL/min (ref >90)
GLUCOSE: 102 mg/dL — AB (ref 70–99)
Potassium: 3.6 mmol/L (ref 3.5–5.1)
Sodium: 139 mmol/L (ref 135–145)

## 2014-07-20 LAB — LIPID PANEL
CHOL/HDL RATIO: 2.5 ratio
Cholesterol: 200 mg/dL (ref 0–200)
HDL: 79 mg/dL (ref >39)
LDL Cholesterol: 107 mg/dL — ABNORMAL HIGH (ref 0–99)
TRIGLYCERIDES: 69 mg/dL (ref <150)
VLDL: 14 mg/dL (ref 0–40)

## 2014-07-20 LAB — GLUCOSE, CAPILLARY: GLUCOSE-CAPILLARY: 96 mg/dL (ref 70–99)

## 2014-07-20 LAB — TROPONIN I
Troponin I: <0.03 ng/mL (ref <0.031)
Troponin I: <0.03 ng/mL (ref <0.031)

## 2014-07-20 LAB — PROTIME-INR
INR: 0.91 (ref 0.00–1.49)
Prothrombin Time: 12.4 seconds (ref 11.6–15.2)

## 2014-07-20 LAB — TSH: TSH: 5.393 u[IU]/mL — ABNORMAL HIGH (ref 0.350–4.500)

## 2014-07-20 LAB — HEMOGLOBIN A1C
HEMOGLOBIN A1C: 5.6 % (ref <5.7)
Mean Plasma Glucose: 114 mg/dL (ref <117)

## 2014-07-20 MED ORDER — ARIPIPRAZOLE 5 MG PO TABS
2.5000 mg | ORAL_TABLET | Freq: Every day | ORAL | Status: DC
  Filled 2014-07-20: qty 1

## 2014-07-20 MED ORDER — SODIUM CHLORIDE 0.9 % IV SOLN
INTRAVENOUS | Status: DC
  Administered 2014-07-20: 100 mL/h via INTRAVENOUS

## 2014-07-20 MED ORDER — ACETAMINOPHEN 325 MG PO TABS: 650.0000 mg | ORAL_TABLET | Freq: Four times a day (QID) | ORAL | Status: DC | PRN

## 2014-07-20 MED ORDER — CARVEDILOL 3.125 MG PO TABS: 3.1250 mg | ORAL_TABLET | Freq: Two times a day (BID) | ORAL | Status: DC

## 2014-07-20 MED ORDER — PANTOPRAZOLE SODIUM 40 MG PO TBEC
40.0000 mg | DELAYED_RELEASE_TABLET | Freq: Every day | ORAL | Status: DC
  Administered 2014-07-20: 40 mg via ORAL
  Filled 2014-07-20: qty 1

## 2014-07-20 MED ORDER — LEVOTHYROXINE SODIUM 25 MCG PO TABS
25.0000 ug | ORAL_TABLET | Freq: Every day | ORAL | Status: DC
  Administered 2014-07-20: 25 ug via ORAL
  Filled 2014-07-20: qty 1

## 2014-07-20 MED ORDER — ONDANSETRON HCL 4 MG PO TABS: 4.0000 mg | ORAL_TABLET | Freq: Four times a day (QID) | ORAL | Status: DC | PRN

## 2014-07-20 MED ORDER — HEPARIN SODIUM (PORCINE) 5000 UNIT/ML IJ SOLN
5000.0000 [IU] | Freq: Three times a day (TID) | INTRAMUSCULAR | Status: DC
  Administered 2014-07-20: 5000 [IU] via SUBCUTANEOUS
  Filled 2014-07-20: qty 1

## 2014-07-20 MED ORDER — SODIUM CHLORIDE 0.9 % IJ SOLN
3.0000 mL | Freq: Two times a day (BID) | INTRAMUSCULAR | Status: DC
Start: 2014-07-20 — End: 2014-07-20
  Administered 2014-07-20: 3 mL via INTRAVENOUS

## 2014-07-20 MED ORDER — NITROGLYCERIN 2 % TD OINT
1.0000 [in_us] | TOPICAL_OINTMENT | Freq: Once | TRANSDERMAL | Status: AC
  Administered 2014-07-20: 1 [in_us] via TOPICAL
  Filled 2014-07-20: qty 1

## 2014-07-20 MED ORDER — ALPRAZOLAM 0.25 MG PO TABS: 0.2500 mg | ORAL_TABLET | Freq: Three times a day (TID) | ORAL | Status: DC | PRN

## 2014-07-20 MED ORDER — FLUVOXAMINE MALEATE 100 MG PO TABS
300.0000 mg | ORAL_TABLET | Freq: Every day | ORAL | Status: DC
  Administered 2014-07-20: 300 mg via ORAL
  Filled 2014-07-20: qty 3

## 2014-07-20 MED ORDER — MORPHINE SULFATE 2 MG/ML IJ SOLN: 2.0000 mg | INTRAMUSCULAR | Status: DC | PRN

## 2014-07-20 MED ORDER — ACETAMINOPHEN 650 MG RE SUPP: 650.0000 mg | Freq: Four times a day (QID) | RECTAL | Status: DC | PRN

## 2014-07-20 MED ORDER — ASPIRIN 81 MG PO CHEW
324.0000 mg | CHEWABLE_TABLET | Freq: Every day | ORAL | Status: DC
  Administered 2014-07-20: 324 mg via ORAL
  Filled 2014-07-20: qty 4

## 2014-07-20 MED ORDER — ONDANSETRON HCL 4 MG/2ML IJ SOLN: 4.0000 mg | Freq: Four times a day (QID) | INTRAMUSCULAR | Status: DC | PRN

## 2014-07-20 MED ORDER — NITROGLYCERIN 0.4 MG SL SUBL: 0.4000 mg | SUBLINGUAL_TABLET | SUBLINGUAL | Status: DC | PRN

## 2014-07-20 NOTE — H&P (Addendum)
[facility] History and Physical  Kiara [name] ZOX:[id number] DOB: May 01, 1961 DOA: 07/19/2014  Referring physician: ED physician PCP: Pcp Not In System  Specialists:   Chief Complaint: Chest pain  HPI: Kiara [name] is a 54 y.o. female with past medical history of GERD, OCD, hypothyroidism, headache, anxiety, who presents with chest pain.  Patient has been having intermittent chest pain for several months. It has been worsening in past 3 days. She reports that her chest pain is intermittent, happens dozen times each day in the past 3 days. Each time, it lasts for about 2 to 5 min. It is located in the substernal area, 7 out of 10 in severity, radiating to jaw, bilateral shoulder and the neck. It is associated with palpitation, dizziness, whole body cramps, mild shortness of breath, blurry vision and ear ringing. She also reports having mild choking feeling and poor balance, but no fall. Patient does not have fever, chills or coughing. Her chest pain is not pleuritic, it is not aggravated by deep breath. No chest wall tenderness. Has no recent long distant traveling. No pain over calf areas bilaterally. Patient does not have unilateral weakness, numbness in her extremities. She denies fever, chills, cough, abdominal pain, diarrhea, constipation, dysuria, urgency, frequency, hematuria, skin rashes, joint pain or leg swelling. When I evaluated patient on the floor, patient does not have chest pain and no dizziness.  Work up in the ED demonstrates negative chest x-ray, no leukocytosis, negative troponin, negative lipase. EKG showed T wave flattening in precordial leads which has no significant change compared with previous EKG on 12/15/12. Patient is admitted to inpatient for further evaluation and treatment.  Review of Systems: As presented in the history of presenting illness, rest negative.  Where does patient live?  At home Can patient participate in ADLs? Yes  Allergy:  Allergies   Allergen Reactions  . Erythromycin     REACTION: unspecified  . Sulfamethoxazole     REACTION: unspecified    Past Medical History  Diagnosis Date  . GERD (gastroesophageal reflux disease)   . OCD (obsessive compulsive disorder)     Past Surgical History  Procedure Laterality Date  . Appendectomy    . Ovarian cyst removal    . Abdominal hysterectomy    . Cholecystectomy      Social History:  reports that she has never smoked. She has never used smokeless tobacco. She reports that she does not drink alcohol or use illicit drugs.  Family History:  Family History  Problem Relation Age of Onset  . Heart attack Mother   . Heart attack Father   . Lung cancer Mother   . Thrombocytopenia Sister     ITP     Prior to Admission medications   Medication Sig Start Date End Date Taking? Authorizing Provider  ALPRAZolam (XANAX) 0.25 MG tablet Take 1 tablet (0.25 mg total) by mouth 3 (three) times daily as needed for sleep. 12/15/12   Geoffery Lyons, MD  ARIPiprazole (ABILIFY) 5 MG tablet Take 2.5 mg by mouth daily.    Historical Provider, MD  fluvoxaMINE (LUVOX) 100 MG tablet Take 300 mg by mouth daily. 1 1/2 tablet by mouth once a day    Historical Provider, MD  levothyroxine (SYNTHROID, LEVOTHROID) 25 MCG tablet Take 25 mcg by mouth daily before breakfast.    Historical Provider, MD    Physical Exam: Filed Vitals:   07/20/14 0100 07/20/14 0130 07/20/14 0145 07/20/14 0200  BP: 146/92 146/93  137/98  Pulse:  58 67 62 69  Temp:      TempSrc:      Resp: Height:      Weight:      SpO2: 96% 96% 94% 100%   General: Not in acute distress HEENT:       Eyes: PERRL, EOMI, no scleral icterus       ENT: No discharge from the ears and nose, no pharynx injection, no tonsillar enlargement.        Neck: No JVD, no bruit, no mass felt. Cardiac: S1/S2, RRR, No murmurs, No gallops or rubs Pulm: Good air movement bilaterally. Clear to auscultation bilaterally. No rales, wheezing,  rhonchi or rubs. Abd: Soft, nondistended, nontender, no rebound pain, no organomegaly, BS present Ext: No edema bilaterally. 2+DP/PT pulse bilaterally Musculoskeletal: No joint deformities, erythema, or stiffness, ROM full Skin: No rashes.  Neuro: Alert and oriented X3, cranial nerves II-XII grossly intact, muscle strength 5/5 in all extremeties, sensation to light touch intact. Brachial reflex 2+ bilaterally. Knee reflex 1+ bilaterally. Negative Babinski's sign. Normal finger to nose test. Gait is normal Psych: Patient is not psychotic, no suicidal or hemocidal ideation.  Labs on Admission:  Basic Metabolic Panel:  Recent Labs Lab 07/19/14 2250  NA 136  K 4.1  CL 103  CO2 26  GLUCOSE 97  BUN 14  CREATININE 0.74  CALCIUM 9.0   Liver Function Tests:  Recent Labs Lab 07/19/14 2250  AST 41*  ALT 44*  ALKPHOS 88  BILITOT 0.9  PROT 6.8  ALBUMIN 4.2    Recent Labs Lab 07/19/14 2250  LIPASE 45   No results for input(s): AMMONIA in the last 168 hours. CBC:  Recent Labs Lab 07/19/14 2250  WBC 7.3  NEUTROABS 3.5  HGB 13.2  HCT 39.7  MCV 87.1  PLT 201   Cardiac Enzymes:  Recent Labs Lab 07/19/14 2250  TROPONINI <0.03    BNP (last 3 results) No results for input(s): PROBNP in the last 8760 hours. CBG: No results for input(s): GLUCAP in the last 168 hours.  Radiological Exams on Admission: Dg Chest 2 View  07/19/2014   CLINICAL DATA:  Pain radiating across the Chest to the neck and jaw all day.  EXAM: CHEST  2 VIEW  COMPARISON:  12/15/2012  FINDINGS: The heart size and mediastinal contours are within normal limits. Both lungs are clear. The visualized skeletal structures are unremarkable.  IMPRESSION: No active cardiopulmonary disease.   Electronically Signed   By: Burman Nieves M.D.   On: 07/19/2014 23:33    EKG: Independently reviewed. T-wave flattening in precordial leads  Assessment/Plan Principal Problem:   Chest pain Active Problems:   OCD  (obsessive compulsive disorder)   GASTROESOPHAGEAL REFLUX, NO ESOPHAGITIS   Hypothyroidism  Chest pain: Etiology is not clear. Patient seems to have episodic spell of chest pain. It is associated with palpitation, vision change, ear ringing, dizziness and whole-body cramps. An important differential diagnosis is pheochromocytoma. Other differential diagnoses includes, coronary artery disease given significant family history, pulmonary embolism (unlikely given Well's score of 0), psych issues and TIA/Stroke. Though stroke is less likely given no unilateral weakness, numbness or tingling sensations, patient has dizziness, chocking feeling and poor balance, making the posterior circulation ischemia an important differential diagnosis.  - will admit to Tele bed for observation as chest pain rule out.  - cycle CE q6 x3 and repeat her EKG in the am  - Nitroglycerin, Morphine, and aspirin - No BB  until rule out pheochromocytoma - Risk factor stratification: will check TSH, FLP and A1C, UDS - Consider cardiology consult if test positive for CEs - check metanephrine to rule out pheochromocytoma - bed side swallowing test given chocking feeling - 2D echo - May consider MRI-brain if all test negative and recurrent dizziness  GERD; -protonix  OCD: Stable. No suicidal or homicidal ideations. -Continue Xanax, Abilify, Luvox  Hypothyroidism: On Synthroid at home  - check TSH -Continue Synthroid   DVT ppx: SQ Heparin      Code Status: Full code Family Communication: None at bed side.  Disposition Plan: Admit to inpatient   Date of Service 07/20/2014    Lorretta Harp [facility] Pager [phone]  If 7PM-7AM, please contact night-coverage [email or web address] Password [email or web address] 07/20/2014, 3:53 AM

## 2014-07-20 NOTE — Discharge Summary (Signed)
Kiara [name], 54 y.o., DOB 11-26-1960, MRN [id number]. Admission date: 07/19/2014 Discharge Date 07/20/2014 Primary MD Pcp Not In System Admitting Physician Lorretta Harp, MD  Admission Diagnosis  Chest pain, rule out acute myocardial infarction [R07.9] Chest pain [R07.9]  Discharge Diagnosis   Principal Problem:   Chest pain Active Problems:   OCD (obsessive compulsive disorder)   GASTROESOPHAGEAL REFLUX, NO ESOPHAGITIS   Hypothyroidism     PCP please follow: - Recheck basic labs including CBC, BMP, during next visit. - These arrange for follow-up with cardiology and gastroenterology as outpatient. - Please follow on labs done during hospitalization including metanephrine serum level.   Past Medical History  Diagnosis Date  . GERD (gastroesophageal reflux disease)   . OCD (obsessive compulsive disorder)     Past Surgical History  Procedure Laterality Date  . Appendectomy    . Ovarian cyst removal    . Abdominal hysterectomy    . Cholecystectomy       Hospital Course See H&P, Labs, Consult and Test reports for all details in brief, patient was admitted for   Principal Problem:   Chest pain Active Problems:   OCD (obsessive compulsive disorder)   GASTROESOPHAGEAL REFLUX, NO ESOPHAGITIS   Hypothyroidism  Admission history of present illness/brief narrative:  is a 54 y.o. female with past medical history of GERD, OCD, hypothyroidism, headache, anxiety, who presents with chest pain.  Patient has been having intermittent chest pain for several months. It has been worsening in past 3 days. She reports that her chest pain is intermittent, happens dozen times each day in the past 3 days. Each time, it lasts for about 2 to 5 min. It is located in the substernal area, 7 out of 10 in severity, radiating to jaw, bilateral shoulder and the neck. It is associated with palpitation, dizziness, whole body cramps, mild shortness of breath, blurry vision and ear ringing. She also reports  having mild choking feeling and poor balance, but no fall. Patient does not have fever, chills or coughing. Her chest pain is not pleuritic, it is not aggravated by deep breath. No chest wall tenderness. Has no recent long distant traveling. No pain over calf areas bilaterally. Patient does not have unilateral weakness, numbness in her extremities. She denies fever, chills, cough, abdominal pain, diarrhea, constipation, dysuria, urgency, frequency, hematuria, skin rashes, joint pain or leg swelling.  Work up in the ED demonstrates negative chest x-ray, no leukocytosis, negative troponin, negative lipase. EKG showed T wave flattening in precordial leads which has no significant change compared with previous EKG on 12/15/12. Patient is admitted to inpatient for further evaluation and treatment.  Patient was admitted overnight for observation, she had total of 3 sets of negative cardiac enzymes, her chest pain appears to be having more gastrointestinal quality, patient reports history of esophageal stricture with dilatation a few years ago by [facility], her symptoms resembles GI dysmotility disorder, patient was encouraged to follow as an outpatient with gastroenterology, as her symptoms most likely related to GI. -As well patient was recommended to follow with cardiology as an outpatient, for a routine stress test. - Patient had an MRI of the brain with no acute significant findings.  Chest pain: - Nontypical, appears to be more of gastrointestinal origin, negative cardiac enzymes 3, no events on telemetry, EKG nonacute. - Patient told to follow with cardiology as an outpatient. - Follow with [facility] for further evaluation, possible recurrent stricture versus esophageal dysmotility.  Dizziness - MRI brain without acute  findings  GERD; -protonix  OCD: Stable. No suicidal or homicidal ideations. -Continue Xanax, Abilify, Luvox  Hypothyroidism: On Synthroid at home   -Continue Synthroid - TSH mildly elevated at 5.39    Significant Tests:  See full reports for all details    Dg Chest 2 View  07/19/2014   CLINICAL DATA:  Pain radiating across the Chest to the neck and jaw all day.  EXAM: CHEST  2 VIEW  COMPARISON:  12/15/2012  FINDINGS: The heart size and mediastinal contours are within normal limits. Both lungs are clear. The visualized skeletal structures are unremarkable.  IMPRESSION: No active cardiopulmonary disease.   Electronically Signed   By: Burman Nieves M.D.   On: 07/19/2014 23:33   Mr Brain Wo Contrast  07/20/2014   CLINICAL DATA:  Episodes of chest pain and dizziness. Blurred vision.  EXAM: MRI HEAD WITHOUT CONTRAST  TECHNIQUE: Multiplanar, multiecho pulse sequences of the brain and surrounding structures were obtained without intravenous contrast.  COMPARISON:  Head CT 02/14/2008  FINDINGS: Diffusion imaging does not show any acute or subacute infarction. The brainstem and cerebellum are normal. The cerebral hemispheres are normal except for a punctate focus of susceptibility artifact in the right medial parietal lobe which could be due to old close head injury, old microvascular infarction or a tiny cavernous lesion. The brain does not show a pattern of small-vessel change diffusely. No cortical or large vessel territory insult. No mass lesion, acute hemorrhage, hydrocephalus or extra-axial collection. Dilated perivascular spaces are present at the base of the brain, not significant. The pituitary gland is normal. Sinuses, middle ears and mastoids are clear.  IMPRESSION: No acute or significant finding. Normal except for a punctate focus of hemosiderin deposition in the right medial parietal lobe, not likely of any clinical relevance.   Electronically Signed   By: Paulina Fusi M.D.   On: 07/20/2014 09:16     Today   Subjective:   Kiara [name] today has no headache,no chest abdominal pain,no new weakness tingling or numbness, feels much  better today.  Objective:   Blood pressure 136/79, pulse 63, temperature 98.3 F (36.8 C), temperature source Oral, resp. rate 18, height 5\' 8"  (1.727 m), weight 84.142 kg (185 lb 8 oz), SpO2 98 %.  Intake/Output Summary (Last 24 hours) at 07/20/14 1255 Last data filed at 07/20/14 9604  Gross per 24 hour  Intake    300 ml  Output      0 ml  Net    300 ml    Exam Awake Alert, Oriented *3, No new F.N deficits, Normal affect [place].AT,PERRAL Supple Neck,No JVD, No cervical lymphadenopathy appriciated.  Symmetrical Chest wall movement, Good air movement bilaterally, CTAB RRR,No Gallops,Rubs or new Murmurs, No Parasternal Heave +ve B.Sounds, Abd Soft, Non tender, No organomegaly appriciated, No rebound -guarding or rigidity. No Cyanosis, Clubbing or edema, No new Rash or bruise  Data Review     CBC w Diff: Lab Results  Component Value Date   WBC 6.5 07/20/2014   HGB 12.9 07/20/2014   HCT 39.7 07/20/2014   PLT 176 07/20/2014   LYMPHOPCT 41 07/19/2014   MONOPCT 9 07/19/2014   EOSPCT 2 07/19/2014   BASOPCT 0 07/19/2014   CMP: Lab Results  Component Value Date   NA 139 07/20/2014   K 3.6 07/20/2014   CL 106 07/20/2014   CO2 29 07/20/2014   BUN 9 07/20/2014   CREATININE 0.69 07/20/2014   PROT 6.8 07/19/2014   ALBUMIN 4.2 07/19/2014  BILITOT 0.9 07/19/2014   ALKPHOS 88 07/19/2014   AST 41* 07/19/2014   ALT 44* 07/19/2014  .  Micro Results No results found for this or any previous visit (from the past 240 hour(s)).   Discharge Instructions      Follow-up Information    Follow up with Pcp Not In System.      Discharge Medications     Medication List    TAKE these medications        ALPRAZolam 0.25 MG tablet  Commonly known as:  XANAX  Take 1 tablet (0.25 mg total) by mouth 3 (three) times daily as needed for sleep.     ARIPiprazole 5 MG tablet  Commonly known as:  ABILIFY  Take 2.5 mg by mouth daily.     fluvoxaMINE 100 MG tablet  Commonly known as:   LUVOX  Take 300 mg by mouth daily. 1 1/2 tablet by mouth once a day     levothyroxine 25 MCG tablet  Commonly known as:  SYNTHROID, LEVOTHROID  Take 25 mcg by mouth daily before breakfast.         Total Time in preparing paper work, data evaluation and todays exam - 35 minutes  Lempi Edwin M.D on 07/20/2014 at 12:55 PM  [facility] Office  [phone]

## 2014-07-20 NOTE — ED Notes (Signed)
Assumed care of patient from Crystal, RN

## 2014-07-20 NOTE — Evaluation (Signed)
Clinical/Bedside Swallow Evaluation Patient Details  Name: MERSEDES ALBER MRN: [id number] Date of Birth: 02-05-1961  Today's Date: 07/20/2014 Time: 1202-1220 SLP Time Calculation (min) (ACUTE ONLY): 18 min  Past Medical History:  Past Medical History  Diagnosis Date  . GERD (gastroesophageal reflux disease)   . OCD (obsessive compulsive disorder)    Past Surgical History:  Past Surgical History  Procedure Laterality Date  . Appendectomy    . Ovarian cyst removal    . Abdominal hysterectomy    . Cholecystectomy     HPI:  54 y.o. female with past medical history of GERD, esophageal dilation per pt report to SLP (2011 & 2013), OCD, hypothyroidism, headache, anxiety admitted with with chest pain.  She also reports having mild choking feeling and poor balance, but no fall. CXR negative, no leukocytosis, negative troponin, negative lipase. EKG showed T wave flattening in precordial leads which has no significant change compared with previous EKG on 12/15/12.    Assessment / Plan / Recommendation Clinical Impression  Pt verbalized and demonstrated symptoms associated with primary esophageal dysaphgia including pharygneal globus sensation. Pharyngeal timing of swallow appeared within functional limits without evidence of laryngeal compromise. She reports getting "choked with food and liquids" which has gradually worsened and an oral acidic taste when her "episodes" of chest pain begin. She is taking reflux meds and has had esophageal dilation 2011 and 2013. SLP suspects esophageal etiology and recommends GI consult for further assessment of esophagus (? EGD for possible stricutre and need for dilation?).  Therapeutic intervention included educated pt on reflux precautions with examples (no prior attempts with several precautions). Continue regular texture diet and thin liquids. No further ST needed.     Aspiration Risk   (mild-mod from reflux)    Diet Recommendation Regular;Thin liquid    Liquid Administration via: Cup;Straw Medication Administration: Whole meds with liquid Supervision: Patient able to self feed Compensations: Follow solids with liquid;Slow rate;Small sips/bites Postural Changes and/or Swallow Maneuvers: Seated upright 90 degrees;Upright 30-60 min after meal    Other  Recommendations Recommended Consults: Consider GI evaluation Oral Care Recommendations: Oral care BID   Follow Up Recommendations  None    Frequency and Duration        Pertinent Vitals/Pain none         Swallow Study          Oral/Motor/Sensory Function Overall Oral Motor/Sensory Function: Appears within functional limits for tasks assessed   Ice Chips Ice chips: Not tested   Thin Liquid Thin Liquid: Within functional limits Presentation: Cup;Straw    Nectar Thick Nectar Thick Liquid: Not tested   Honey Thick Honey Thick Liquid: Not tested   Puree Puree: Not tested   Solid   GO    Solid: Within functional limits       Royce Macadamia 07/20/2014,1:14 PM  Breck Coons [place].Ed [employer] [phone]

## 2014-07-22 NOTE — Progress Notes (Signed)
UR Completed Emmily Pellegrin Graves-Bigelow, RN,BSN [phone]  

## 2014-07-24 LAB — METANEPHRINES, PLASMA
Metanephrine, Free: 29 pg/mL (ref <=57)
Normetanephrine, Free: 62 pg/mL (ref <=148)
Total Metanephrines-Plasma: 91 pg/mL (ref <=205)

## 2015-03-07 ENCOUNTER — Encounter (HOSPITAL_BASED_OUTPATIENT_CLINIC_OR_DEPARTMENT_OTHER): Payer: Self-pay | Admitting: *Deleted

## 2015-03-07 ENCOUNTER — Emergency Department (HOSPITAL_BASED_OUTPATIENT_CLINIC_OR_DEPARTMENT_OTHER)
Admission: EM | Admit: 2015-03-07 | Discharge: 2015-03-07 | Disposition: A | Discharge status: Home / Self Care | Payer: BLUE CROSS/BLUE SHIELD | Attending: Emergency Medicine | Admitting: Emergency Medicine

## 2015-03-07 DIAGNOSIS — Y998 Other external cause status: Secondary | ICD-10-CM | POA: Diagnosis not present

## 2015-03-07 DIAGNOSIS — Y9389 Activity, other specified: Secondary | ICD-10-CM | POA: Insufficient documentation

## 2015-03-07 DIAGNOSIS — X58XXXA Exposure to other specified factors, initial encounter: Secondary | ICD-10-CM | POA: Insufficient documentation

## 2015-03-07 DIAGNOSIS — S0591XA Unspecified injury of right eye and orbit, initial encounter: Secondary | ICD-10-CM | POA: Insufficient documentation

## 2015-03-07 DIAGNOSIS — K219 Gastro-esophageal reflux disease without esophagitis: Secondary | ICD-10-CM | POA: Insufficient documentation

## 2015-03-07 DIAGNOSIS — Z8659 Personal history of other mental and behavioral disorders: Secondary | ICD-10-CM | POA: Diagnosis not present

## 2015-03-07 DIAGNOSIS — Y9289 Other specified places as the place of occurrence of the external cause: Secondary | ICD-10-CM | POA: Diagnosis not present

## 2015-03-07 DIAGNOSIS — S058X1A Other injuries of right eye and orbit, initial encounter: Secondary | ICD-10-CM

## 2015-03-07 DIAGNOSIS — Z792 Long term (current) use of antibiotics: Secondary | ICD-10-CM | POA: Diagnosis not present

## 2015-03-07 DIAGNOSIS — Z79899 Other long term (current) drug therapy: Secondary | ICD-10-CM | POA: Diagnosis not present

## 2015-03-07 MED ORDER — TETRACAINE HCL 0.5 % OP SOLN
2.0000 [drp] | Freq: Once | OPHTHALMIC | Status: AC
  Administered 2015-03-07: 2 [drp] via OPHTHALMIC
  Filled 2015-03-07: qty 2

## 2015-03-07 MED ORDER — ERYTHROMYCIN 5 MG/GM OP OINT
TOPICAL_OINTMENT | Freq: Four times a day (QID) | OPHTHALMIC | Status: DC
  Administered 2015-03-07: 1 via OPHTHALMIC
  Filled 2015-03-07: qty 3.5

## 2015-03-07 MED ORDER — FLUORESCEIN SODIUM 1 MG OP STRP
1.0000 | ORAL_STRIP | Freq: Once | OPHTHALMIC | Status: AC
  Administered 2015-03-07: 1 via OPHTHALMIC
  Filled 2015-03-07: qty 1

## 2015-03-07 NOTE — ED Provider Notes (Signed)
CSN: [id number]     Arrival date & time 03/07/15  0040 History   First MD Initiated Contact with Patient 03/07/15 0224     Chief Complaint  Patient presents with  . 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 She rubbed her right eye and this was followed by moderate to severe pain in her right eye which feels like a foreign object in the upper part of her eye underneath her upper eyelid. She describes it as stabbing. She is put over-the-counter eyedrops and without relief. She is not aware of injuring her eye. She denies blurred vision. There is associated redness and watering of the right eye.  Past Medical History  Diagnosis Date  . GERD (gastroesophageal reflux disease)   . OCD (obsessive compulsive disorder)    Past Surgical History  Procedure Laterality Date  . Appendectomy    . Ovarian cyst removal    . Abdominal hysterectomy    . Cholecystectomy     Family History  Problem Relation Age of Onset  . Heart attack Mother   . Heart attack Father   . Lung cancer Mother   . Thrombocytopenia Sister     ITP   Social History  Substance Use Topics  . Smoking status: Never Smoker   . Smokeless tobacco: Never Used  . Alcohol Use: No   OB History    No data available     Review of Systems  All other systems reviewed and are negative.   Allergies  Cinnamon; Erythromycin; and Sulfamethoxazole  Home Medications   Prior to Admission medications   Medication Sig Start Date End Date Taking? Authorizing Provider  clindamycin (CLEOCIN) 150 MG capsule Take 150 mg by mouth 3 (three) times daily.   Yes Historical Provider, MD  fluvoxaMINE (LUVOX) 100 MG tablet Take 300 mg by mouth at bedtime.     Historical Provider, MD  levothyroxine (SYNTHROID, LEVOTHROID) 25 MCG tablet Take 25 mcg by mouth daily before breakfast.     Historical Provider, MD  omeprazole (PRILOSEC) 40 MG capsule Take 40 mg by mouth 2 (two) times daily as needed (for acid reflux and heartburn).    Historical Provider, MD   BP 140/91 mmHg  Pulse 60  Temp(Src) 97.8 F (36.6 C) (Oral)  Resp 16  Ht  (1.727 m)  Wt 180 lb (81.647 kg)  BMI 27.38 kg/m2   Physical Exam  General: Well-developed, well-nourished female in no acute distress; appearance consistent with age of record HENT: normocephalic; atraumatic Eyes: pupils equal, round and reactive to light; extraocular muscles intact; no foreign object seen a version of right eyelids; no right corneal abrasion seen on fluorescein instillation; right conjunctival injection with tearing Neck: supple Heart: regular rate and rhythm Lungs: Normal respiratory effort and excursion Abdomen: soft; nondistended Extremities: No deformity; full range of motion Neurologic: Awake, alert and oriented; motor function intact in all extremities and symmetric; no facial droop Skin: Warm and dry Psychiatric: Normal mood and affect    ED Course  Procedures (including critical care time)   MDM  We'll treat with topical antibiotics ointment. Patient hasn't a doctor with whom she will follow up the day after tomorrow if not improving.  Paula Libra, MD 03/07/15 [phone]

## 2015-03-07 NOTE — ED Notes (Signed)
Pt ambulating independently w/ steady gait on d/c in no acute distress, A&Ox4.D/c instructions reviewed w/ pt and family - pt and family deny any further questions or concerns at present.  

## 2015-03-07 NOTE — ED Notes (Signed)
Pt reports rt eye irritation - states this evening she removed her glasses and rubbed her eye and immediately began experiencing eye pain/irritation - pt has attempted using eye drops w/o relief and states she can not see any foreign body.

## 2015-04-19 ENCOUNTER — Emergency Department (HOSPITAL_COMMUNITY)
Admission: EM | Admit: 2015-04-19 | Discharge: 2015-04-19 | Disposition: A | Discharge status: Home / Self Care | Payer: BLUE CROSS/BLUE SHIELD | Attending: Emergency Medicine | Admitting: Emergency Medicine

## 2015-04-19 ENCOUNTER — Encounter (HOSPITAL_COMMUNITY): Payer: Self-pay | Admitting: Emergency Medicine

## 2015-04-19 DIAGNOSIS — R451 Restlessness and agitation: Secondary | ICD-10-CM | POA: Insufficient documentation

## 2015-04-19 DIAGNOSIS — Z792 Long term (current) use of antibiotics: Secondary | ICD-10-CM | POA: Diagnosis not present

## 2015-04-19 DIAGNOSIS — Z8659 Personal history of other mental and behavioral disorders: Secondary | ICD-10-CM | POA: Diagnosis not present

## 2015-04-19 DIAGNOSIS — K219 Gastro-esophageal reflux disease without esophagitis: Secondary | ICD-10-CM | POA: Insufficient documentation

## 2015-04-19 DIAGNOSIS — Z79899 Other long term (current) drug therapy: Secondary | ICD-10-CM | POA: Insufficient documentation

## 2015-04-19 DIAGNOSIS — T368X5A Adverse effect of other systemic antibiotics, initial encounter: Secondary | ICD-10-CM | POA: Diagnosis not present

## 2015-04-19 DIAGNOSIS — T50905A Adverse effect of unspecified drugs, medicaments and biological substances, initial encounter: Secondary | ICD-10-CM

## 2015-04-19 MED ORDER — HYDROXYZINE PAMOATE 25 MG PO CAPS: 25.0000 mg | ORAL_CAPSULE | Freq: Three times a day (TID) | ORAL | Status: AC | PRN

## 2015-04-19 MED ORDER — HYDROXYZINE HCL 25 MG PO TABS
25.0000 mg | ORAL_TABLET | Freq: Once | ORAL | Status: AC
  Administered 2015-04-19: 25 mg via ORAL
  Filled 2015-04-19: qty 1

## 2015-04-19 NOTE — Discharge Instructions (Signed)

## 2015-04-19 NOTE — ED Provider Notes (Signed)
CSN: [id number]     Arrival date & time 04/19/15  1220 History   First MD Initiated Contact with Patient 04/19/15 1229     Chief Complaint  Patient presents with  . Medication Reaction  . Agitation     (Consider location/radiation/quality/duration/timing/severity/associated sxs/prior Treatment) HPI Comments: The pt had dental surgery a couple of days ago and at that time was placed on clindamycin, and was given both IV steroid and a decadron taper and has had several days of feeling agitated - can't focus, and nothing makes this better - she had red blotches to her skin on Friday but that has cleared up - she has minimal pain in her mouth and has not been using the pain medicines - denies f/cn/v and no cough / sob / blurred vision or balance problems / numbness / weakness.  The history is provided by the patient.    Past Medical History  Diagnosis Date  . GERD (gastroesophageal reflux disease)   . OCD (obsessive compulsive disorder)    Past Surgical History  Procedure Laterality Date  . Appendectomy    . Ovarian cyst removal    . Abdominal hysterectomy    . Cholecystectomy     Family History  Problem Relation Age of Onset  . Heart attack Mother   . Heart attack Father   . Lung cancer Mother   . Thrombocytopenia Sister     ITP   Social History  Substance Use Topics  . Smoking status: Never Smoker   . Smokeless tobacco: Never Used  . Alcohol Use: No   OB History    No data available     Review of Systems  All other systems reviewed and are negative.     Allergies  Cinnamon; Erythromycin; and Sulfamethoxazole  Home Medications   Prior to Admission medications   Medication Sig Start Date End Date Taking? Authorizing Provider  clindamycin (CLEOCIN) 150 MG capsule Take 150 mg by mouth 3 (three) times daily.    Historical Provider, MD  fluvoxaMINE (LUVOX) 100 MG tablet Take 300 mg by mouth at bedtime.     Historical Provider, MD  hydrOXYzine (VISTARIL) 25 MG  capsule Take 1 capsule (25 mg total) by mouth 3 (three) times daily as needed. 04/19/15   Eber Hong, MD  levothyroxine (SYNTHROID, LEVOTHROID) 25 MCG tablet Take 25 mcg by mouth daily before breakfast.    Historical Provider, MD  omeprazole (PRILOSEC) 40 MG capsule Take 40 mg by mouth 2 (two) times daily as needed (for acid reflux and heartburn).    Historical Provider, MD   BP 128/89 mmHg  Pulse 57  Temp(Src) 97.5 F (36.4 C) (Oral)  Resp 16  Ht  (1.727 m)  Wt 185 lb 1.6 oz (83.961 kg)  BMI 28.15 kg/m2  SpO2 98% Physical Exam  Constitutional: She appears well-developed and well-nourished. No distress.  HENT:  Head: Normocephalic and atraumatic.  Mouth/Throat: Oropharynx is clear and moist. No oropharyngeal exudate.  Dental procedure - L lower jaw - well healed - sutures in place, no sweling, drainage or foul odor  Eyes: Conjunctivae and EOM are normal. Pupils are equal, round, and reactive to light. Right eye exhibits no discharge. Left eye exhibits no discharge. No scleral icterus.  Neck: Normal range of motion. Neck supple. No JVD present. No thyromegaly present.  Cardiovascular: Normal rate, regular rhythm, normal heart sounds and intact distal pulses.  Exam reveals no gallop and no friction rub.   No murmur heard. Pulmonary/Chest: Effort  normal and breath sounds normal. No respiratory distress. She has no wheezes. She has no rales.  Abdominal: Soft. Bowel sounds are normal. She exhibits no distension and no mass. There is no tenderness.  Musculoskeletal: Normal range of motion. She exhibits no edema or tenderness.  Lymphadenopathy:    She has no cervical adenopathy.  Neurological: She is alert. Coordination normal.  Speech clear and well thought out - has normal coordination and normal mentation - normal strength bilateraly  Skin: Skin is warm and dry. No rash noted. No erythema.  No rash  Psychiatric: She has a normal mood and affect. Her behavior is normal.  Nursing  note and vitals reviewed.   ED Course  Procedures (including critical care time) Labs Review Labs Reviewed - No data to display  Imaging Review No results found. I have personally reviewed and evaluated these images and lab results as part of my medical decision-making.    MDM   Final diagnoses:  Medication side effect, initial encounter    VS normal - likely reaction to steroids or to the abx - pt given Rx for amox and given dose of vistaril here - home with rx, will have stop clinda if sx don't iprove over next couple of days - has no hx of allergy to clinda and had one month agl - likely steroid reaction.  Meds given in ED:  Medications  hydrOXYzine (ATARAX/VISTARIL) tablet 25 mg (not administered)    New Prescriptions   HYDROXYZINE (VISTARIL) 25 MG CAPSULE    Take 1 capsule (25 mg total) by mouth 3 (three) times daily as needed.        Nyilah Kight MilEber Hong0/[birth year]/16 [phone]

## 2015-04-19 NOTE — ED Notes (Signed)
Rash has dissipated.  Not acute distress noted. VSS, alert and oriented.  Airway intact

## 2015-04-19 NOTE — ED Notes (Signed)
Pt sts had oral sx last week and taking antibiotics currently and steroids stopped Friday; pt sts taking supplements as well and sts she did not feel right and got "red splotches to skin and felt anxious" so stopped taking supplements over weekend and now having increased agitation

## 2015-07-20 ENCOUNTER — Encounter (HOSPITAL_BASED_OUTPATIENT_CLINIC_OR_DEPARTMENT_OTHER): Payer: Self-pay | Admitting: *Deleted

## 2015-07-20 ENCOUNTER — Other Ambulatory Visit: Payer: Self-pay

## 2015-07-20 ENCOUNTER — Emergency Department (HOSPITAL_BASED_OUTPATIENT_CLINIC_OR_DEPARTMENT_OTHER): Payer: BLUE CROSS/BLUE SHIELD

## 2015-07-20 ENCOUNTER — Emergency Department (HOSPITAL_BASED_OUTPATIENT_CLINIC_OR_DEPARTMENT_OTHER)
Admission: EM | Admit: 2015-07-20 | Discharge: 2015-07-20 | Disposition: A | Discharge status: Home / Self Care | Payer: BLUE CROSS/BLUE SHIELD | Attending: Emergency Medicine | Admitting: Emergency Medicine

## 2015-07-20 DIAGNOSIS — F429 Obsessive-compulsive disorder, unspecified: Secondary | ICD-10-CM | POA: Diagnosis not present

## 2015-07-20 DIAGNOSIS — R5381 Other malaise: Secondary | ICD-10-CM | POA: Diagnosis not present

## 2015-07-20 DIAGNOSIS — R5383 Other fatigue: Secondary | ICD-10-CM | POA: Insufficient documentation

## 2015-07-20 DIAGNOSIS — R42 Dizziness and giddiness: Secondary | ICD-10-CM | POA: Insufficient documentation

## 2015-07-20 DIAGNOSIS — K219 Gastro-esophageal reflux disease without esophagitis: Secondary | ICD-10-CM | POA: Diagnosis not present

## 2015-07-20 DIAGNOSIS — Z79899 Other long term (current) drug therapy: Secondary | ICD-10-CM | POA: Diagnosis not present

## 2015-07-20 DIAGNOSIS — R55 Syncope and collapse: Secondary | ICD-10-CM | POA: Diagnosis present

## 2015-07-20 DIAGNOSIS — R002 Palpitations: Secondary | ICD-10-CM | POA: Insufficient documentation

## 2015-07-20 DIAGNOSIS — Z79818 Long term (current) use of other agents affecting estrogen receptors and estrogen levels: Secondary | ICD-10-CM | POA: Diagnosis not present

## 2015-07-20 DIAGNOSIS — Z792 Long term (current) use of antibiotics: Secondary | ICD-10-CM | POA: Diagnosis not present

## 2015-07-20 DIAGNOSIS — R51 Headache: Secondary | ICD-10-CM | POA: Diagnosis not present

## 2015-07-20 DIAGNOSIS — H538 Other visual disturbances: Secondary | ICD-10-CM | POA: Insufficient documentation

## 2015-07-20 DIAGNOSIS — Z793 Long term (current) use of hormonal contraceptives: Secondary | ICD-10-CM | POA: Diagnosis not present

## 2015-07-20 LAB — BASIC METABOLIC PANEL
ANION GAP: 6 (ref 5–15)
BUN: 12 mg/dL (ref 6–20)
CALCIUM: 9.6 mg/dL (ref 8.9–10.3)
CHLORIDE: 105 mmol/L (ref 101–111)
CO2: 25 mmol/L (ref 22–32)
CREATININE: 0.63 mg/dL (ref 0.44–1.00)
GFR calc Af Amer: >60 mL/min (ref >60)
Glucose, Bld: 96 mg/dL (ref 65–99)
Potassium: 3.6 mmol/L (ref 3.5–5.1)
Sodium: 136 mmol/L (ref 135–145)

## 2015-07-20 LAB — URINALYSIS, ROUTINE W REFLEX MICROSCOPIC
Bilirubin Urine: NEGATIVE
Glucose, UA: NEGATIVE mg/dL
HGB URINE DIPSTICK: NEGATIVE
Ketones, ur: NEGATIVE mg/dL
Leukocytes, UA: NEGATIVE
NITRITE: NEGATIVE
Protein, ur: NEGATIVE mg/dL
Specific Gravity, Urine: 1.021 (ref 1.005–1.030)
pH: 5 (ref 5.0–8.0)

## 2015-07-20 LAB — TROPONIN I

## 2015-07-20 LAB — CBC
HCT: 41.6 % (ref 36.0–46.0)
HEMOGLOBIN: 13.7 g/dL (ref 12.0–15.0)
MCH: 28.1 pg (ref 26.0–34.0)
MCHC: 32.9 g/dL (ref 30.0–36.0)
MCV: 85.4 fL (ref 78.0–100.0)
Platelets: 202 10*3/uL (ref 150–400)
RBC: 4.87 MIL/uL (ref 3.87–5.11)
RDW: 12.3 % (ref 11.5–15.5)
WBC: 6.3 10*3/uL (ref 4.0–10.5)

## 2015-07-20 LAB — PROTIME-INR
INR: 0.93 (ref 0.00–1.49)
Prothrombin Time: 12.7 seconds (ref 11.6–15.2)

## 2015-07-20 LAB — CBG MONITORING, ED: Glucose-Capillary: 93 mg/dL (ref 65–99)

## 2015-07-20 MED ORDER — SODIUM CHLORIDE 0.9 % IV SOLN: 1000.0000 mL | INTRAVENOUS | Status: DC

## 2015-07-20 MED ORDER — SODIUM CHLORIDE 0.9 % IV SOLN: 1000.0000 mL | Freq: Once | INTRAVENOUS | Status: DC

## 2015-07-20 NOTE — ED Notes (Signed)
[facility] Drove herself here afterward. Hx of the same syncope following jaw pain last year and had a negative cardiac workup. No chest pain. States she has been having jaw pain off and on all day.

## 2015-07-20 NOTE — ED Provider Notes (Signed)
CSN: [id number]     Arrival date & time 07/20/15  2019 History   First MD Initiated Contact with Patient 07/20/15 2109     Chief Complaint  Patient presents with  . Loss of Consciousness     (Consider location/radiation/quality/duration/timing/severity/associated sxs/prior Treatment) Patient is a 55 y.o. female presenting with syncope. The history is provided by the patient.  Loss of Consciousness Episode history:  Multiple Most recent episode:  Today Duration: couple of seconds. Timing:  Unable to specify Progression:  Unable to specify Context: normal activity   Context: not blood draw, not bowel movement, not exertion, not medication change, not sitting down, not standing up and not urination   Witnessed: yes   Relieved by:  Nothing Worsened by:  Nothing tried Ineffective treatments:  None tried Associated symptoms: dizziness, headaches, malaise/fatigue, palpitations and visual change (blurry vision)   Associated symptoms: no chest pain, no diaphoresis, no difficulty breathing, no fever, no focal sensory loss, no focal weakness, no nausea, no recent fall, no recent injury, no rectal bleeding, no seizures, no shortness of breath, no vomiting and no weakness   Risk factors: no congenital heart disease, no coronary artery disease, no seizures and no vascular disease    Kiara [name] is a 55 y.o. female with PMH significant for GERD, OCD, hypothyroidism who presents with syncope.  Patient reports approximately 8 PM she was standing at [facility] and suddenly felt off balance and blurry vision.  She then spontaneously awoke a couple of seconds later when she was being helped off of the floor.  She reports a similar event happened "a while ago" and she had a negative cardiac workup.  She reports her last heart cath was in 2007, and was normal.  She denies slurred speech, facial droop, unilateral weakness, CP, SOB, N/V/D/C, bloody stools, urinary symptoms, or diaphoresis.  She does report  she has been feeling more stressed and tired lately.  Denies recent medication changes or FHx of sudden cardiac death.  Past Medical History  Diagnosis Date  . GERD (gastroesophageal reflux disease)   . OCD (obsessive compulsive disorder)    Past Surgical History  Procedure Laterality Date  . Appendectomy    . Ovarian cyst removal    . Abdominal hysterectomy    . Cholecystectomy     Family History  Problem Relation Age of Onset  . Heart attack Mother   . Heart attack Father   . Lung cancer Mother   . Thrombocytopenia Sister     ITP   Social History  Substance Use Topics  . Smoking status: Never Smoker   . Smokeless tobacco: Never Used  . Alcohol Use: No   OB History    No data available     Review of Systems  Constitutional: Positive for malaise/fatigue. Negative for fever and diaphoresis.  Eyes: Positive for visual disturbance (blurred vision).  Respiratory: Negative for shortness of breath.   Cardiovascular: Positive for palpitations and syncope. Negative for chest pain.  Gastrointestinal: Negative for nausea, vomiting, abdominal pain, diarrhea, constipation and blood in stool.       Heartburn  Neurological: Positive for dizziness, syncope and headaches. Negative for tremors, focal weakness, seizures, facial asymmetry, speech difficulty, weakness, light-headedness and numbness.  Psychiatric/Behavioral:       Increased stress  All other systems reviewed and are negative.     Allergies  Cinnamon; Erythromycin; and Sulfamethoxazole  Home Medications   Prior to Admission medications   Medication Sig Start Date End Date Taking?  Authorizing Provider  estradiol (ESTRACE) 1 MG tablet Take 1 mg by mouth daily.   Yes Historical Provider, MD  Progesterone Micronized (PROGESTERONE PO) Take by mouth.   Yes Historical Provider, MD  clindamycin (CLEOCIN) 150 MG capsule Take 150 mg by mouth 3 (three) times daily.    Historical Provider, MD  fluvoxaMINE (LUVOX) 100 MG tablet  Take 300 mg by mouth at bedtime.     Historical Provider, MD  hydrOXYzine (VISTARIL) 25 MG capsule Take 1 capsule (25 mg total) by mouth 3 (three) times daily as needed. 04/19/15   Eber HongBrian Miller, MD  levothyroxine (SYNTHROID, LEVOTHROID) 25 MCG tablet Take 25 mcg by mouth daily before breakfast.    Historical Provider, MD  omeprazole (PRILOSEC) 40 MG capsule Take 40 mg by mouth 2 (two) times daily as needed (for acid reflux and heartburn).    Historical Provider, MD   BP 144/96 mmHg  Pulse 63  Temp(Src) 98.4 F (36.9 C) (Oral)  Resp 15  Ht 5\' 8"  (1.727 m)  Wt 81.647 kg  BMI 27.38 kg/m2  SpO2 95% Physical Exam  Constitutional: She is oriented to person, place, and time. She appears well-developed and well-nourished.  HENT:  Head: Normocephalic and atraumatic.  Mouth/Throat: Oropharynx is clear and moist.  Eyes: Conjunctivae are normal. Pupils are equal, round, and reactive to light.  Neck: Normal range of motion. Neck supple.  Cardiovascular: Normal rate, regular rhythm and normal heart sounds.   No murmur heard. Pulmonary/Chest: Effort normal and breath sounds normal. No accessory muscle usage or stridor. No respiratory distress. She has no wheezes. She has no rhonchi. She has no rales.  Abdominal: Soft. Bowel sounds are normal. She exhibits no distension. There is no tenderness.  Musculoskeletal: Normal range of motion.  Lymphadenopathy:    She has no cervical adenopathy.  Neurological: She is alert and oriented to person, place, and time.  Mental Status:   AOx3.  Speech clear without dysarthria. Cranial Nerves:  I-not tested  II-PERRLA  III, IV, VI-EOMs intact  V-temporal and masseter strength intact  VII-symmetrical facial movements intact, no facial droop  VIII-hearing grossly intact bilaterally  IX, X-gag intact  XI-strength of sternomastoid and trapezius muscles 5/5  XII-tongue midline Motor:   Good muscle bulk and tone  Strength 5/5 bilaterally in upper and lower  extremities   Cerebellar--RAMs, finger to nose intact  Romberg--maintains balance with eyes closed  Casual and tandem gait normal without ataxia  No pronator drift Sensory:  Intact in upper and lower extremities   Skin: Skin is warm and dry.  Psychiatric: She has a normal mood and affect. Her behavior is normal.    ED Course  Procedures (including critical care time) Labs Review Labs Reviewed  BASIC METABOLIC PANEL  CBC  URINALYSIS, ROUTINE W REFLEX MICROSCOPIC (NOT [facility])  TROPONIN I  PROTIME-INR  CBG MONITORING, ED  CBG MONITORING, ED    Imaging Review Dg Chest 2 View  07/20/2015  CLINICAL DATA:  Pt states that she passed out, headache, blurred vision, EXAM: CHEST  2 VIEW COMPARISON:  07/19/2014 FINDINGS: The heart size and mediastinal contours are within normal limits. Both lungs are clear. The visualized skeletal structures are unremarkable. IMPRESSION: No active cardiopulmonary disease. Electronically Signed   By: Norva PavlovElizabeth  Sakuma M.D.   On: 07/20/2015 22:06   Ct Head Wo Contrast  07/20/2015  CLINICAL DATA:  Acute onset of syncope. High blood pressure and bifrontal headache. Blurred vision and dizziness. Initial encounter. EXAM: CT HEAD WITHOUT  CONTRAST TECHNIQUE: Contiguous axial images were obtained from the base of the skull through the vertex without intravenous contrast. COMPARISON:  CT of the head performed 02/14/2008, and MRI of the brain performed 07/20/2014 FINDINGS: There is no evidence of acute infarction, mass lesion, or intra- or extra-axial hemorrhage on CT. Mildly prominent Virchow-Robin spaces are suggested at the basal ganglia bilaterally. The posterior fossa, including the cerebellum, brainstem and fourth ventricle, is within normal limits. The third and lateral ventricles, and basal ganglia are unremarkable in appearance. The cerebral hemispheres are symmetric in appearance, with normal gray-white differentiation. No mass effect or midline shift is seen. There is  no evidence of fracture; visualized osseous structures are unremarkable in appearance. The visualized portions of the orbits are within normal limits. The paranasal sinuses and mastoid air cells are well-aerated. No significant soft tissue abnormalities are seen. IMPRESSION: Unremarkable noncontrast CT of the head. Electronically Signed   By: Roanna Raider M.D.   On: 07/20/2015 21:57   I have personally reviewed and evaluated these images and lab results as part of my medical decision-making.   EKG Interpretation None     ED ECG REPORT   Date: 07/20/2015  Rate: 69  Rhythm: normal sinus rhythm  QRS Axis: left  Intervals: normal  ST/T Wave abnormalities: normal  Conduction Disutrbances:none  Narrative Interpretation:   Old EKG Reviewed: unchanged  I have personally reviewed the EKG tracing and agree with the computerized printout as noted.  MDM   Final diagnoses:  Syncope, unspecified syncope type   Patient presents with syncopal episode earlier this evening.  No fever, CP, SOB, abdominal pain, facial droop, slurred speech, unilateral weakness.  She reports hx of similar event and negative cardiac workup.  VSS, NAD.  On exam, heart RRR, lungs CTAB, abdomen soft and benign.  No focal neurological deficits.  Will initiate syncope workup.  Labs unremarkable.  Head CT negative. CXR negative.  EKG NSR.  Low suspicion for cardiac etiology or neurological etiology.  Vasovagal?  Labs are reassuring.  VSS.  Patient appears non-toxic.  Stable for discharge.  Follow up PCP this week.  Discussed return precautions.  Patient agrees and acknowledges the above plan for discharge.  Case has been discussed with Dr. Rosalia Hammers who agrees with the above plan for discharge.      Cheri Fowler, PA-C 07/20/15 2240  Margarita Grizzle, MD 07/20/15 [phone]

## 2015-07-20 NOTE — Discharge Instructions (Signed)

## 2015-12-11 ENCOUNTER — Emergency Department (HOSPITAL_COMMUNITY): Payer: Worker's Compensation

## 2015-12-11 ENCOUNTER — Encounter (HOSPITAL_COMMUNITY): Payer: Self-pay | Admitting: Emergency Medicine

## 2015-12-11 ENCOUNTER — Emergency Department (HOSPITAL_COMMUNITY)
Admission: EM | Admit: 2015-12-11 | Discharge: 2015-12-11 | Disposition: A | Discharge status: Home / Self Care | Payer: Worker's Compensation | Attending: Emergency Medicine | Admitting: Emergency Medicine

## 2015-12-11 DIAGNOSIS — W010XXA Fall on same level from slipping, tripping and stumbling without subsequent striking against object, initial encounter: Secondary | ICD-10-CM | POA: Diagnosis not present

## 2015-12-11 DIAGNOSIS — Y92000 Kitchen of unspecified non-institutional (private) residence as  the place of occurrence of the external cause: Secondary | ICD-10-CM | POA: Diagnosis not present

## 2015-12-11 DIAGNOSIS — S4992XA Unspecified injury of left shoulder and upper arm, initial encounter: Secondary | ICD-10-CM | POA: Diagnosis present

## 2015-12-11 DIAGNOSIS — S42215A Unspecified nondisplaced fracture of surgical neck of left humerus, initial encounter for closed fracture: Secondary | ICD-10-CM | POA: Insufficient documentation

## 2015-12-11 DIAGNOSIS — Y990 Civilian activity done for income or pay: Secondary | ICD-10-CM | POA: Diagnosis not present

## 2015-12-11 DIAGNOSIS — Y939 Activity, unspecified: Secondary | ICD-10-CM | POA: Insufficient documentation

## 2015-12-11 DIAGNOSIS — S4292XA Fracture of left shoulder girdle, part unspecified, initial encounter for closed fracture: Secondary | ICD-10-CM

## 2015-12-11 HISTORY — DX: Fatty (change of) liver, not elsewhere classified: K76.0

## 2015-12-11 MED ORDER — OXYCODONE-ACETAMINOPHEN 5-325 MG PO TABS
1.0000 | ORAL_TABLET | Freq: Once | ORAL | Status: AC
  Administered 2015-12-11: 1 via ORAL
  Filled 2015-12-11: qty 1

## 2015-12-11 MED ORDER — OXYCODONE-ACETAMINOPHEN 5-325 MG PO TABS: 1.0000 | ORAL_TABLET | Freq: Four times a day (QID) | ORAL | Status: AC | PRN

## 2015-12-11 NOTE — ED Notes (Signed)
Per EMS. Pt fell at work today and complains of L shoulder and scapula pain. No deformity noted by EMS. Did not hit her head or LOC.

## 2015-12-11 NOTE — Discharge Instructions (Signed)
Shoulder Fracture You have a fractured humerus (bone in the upper arm) at the shoulder just below the ball of the shoulder joint. Most of the time the bones of a broken shoulder are in an acceptable position. Usually the injury can be treated with a shoulder immobilizer or sling and swath bandage. These devices support the arm and prevent any shoulder movement. If the bones are not in a good position, then surgery is sometimes needed. Shoulder fractures usually cause swelling, pain, and discoloration around the upper arm initially. They heal in 8-12 weeks with proper treatment. Rest in bed or a reclining chair as long as your shoulder is very painful. Sitting up generally results in less pain at the fracture site. Do not remove your shoulder bandage until your caregiver approves. You may apply ice packs over the shoulder for 20-30 minutes every 2 hours for the next 2-3 days to reduce the pain and swelling. Use your pain medicine as prescribed.  SEEK IMMEDIATE MEDICAL CARE IF:  You develop severe shoulder pain unrelieved by rest and taking pain medicine.  You have pain, numbness, tingling, or weakness in the hand or wrist.  You develop shortness of breath, chest pain, severe weakness, or fainting.  You have severe pain with motion of the fingers or wrist. MAKE SURE YOU:   Understand these instructions.  Will watch your condition.  Will get help right away if you are not doing well or get worse.   This information is not intended to replace advice given to you by your health care provider. Make sure you discuss any questions you have with your health care provider.   Document Released: 08/11/2004 Document Revised: 09/26/2011 Document Reviewed: 10/22/2008 Elsevier Interactive Patient Education 2016 [employer].  How to Use a Sling A sling is a type of hanging bandage. You wear it around your neck to protect an injured arm, shoulder, or other body part. You may need to wear a sling to keep  you from moving the injured body part while it heals. Keeping the injured part of your body still reduces pain and speeds up healing. Your doctor may suggest you use a sling if you have:  A broken arm.  A broken collarbone.  A shoulder injury.  Surgery. RISKS AND COMPLICATIONS Wearing a sling the wrong way can:  Make your injury worse.  Cause stiffness or numbness.  Affect blood circulation in your arm and hand. This can cause tingling or numbness in your fingers or hands. HOW TO USE A SLING The way that you should use a sling depends on your injury. It is important that you follow all of your doctor's instructions for your injury. Also follow these general suggestions:  Wear the sling so that your arm bends 90 degrees at the elbow. That is like a right angle or the shape of a capital letter "L." The sling should also support your wrist and hand.  Try not to move your arm.  Do not lie down flat on your back while you have to wear a sling. Sleep in a recliner or use pillows to raise your upper body in bed.  Do not twist, raise, or move your arm in a way that could make your injury worse.  Do not lean on your arm while you have to wear a sling.  Do not lift anything while you have to wear a sling. GET HELP IF:  You have bruising, swelling, or pain that is getting worse.  Your pain medicine is  not helping.  You have a fever. GET HELP RIGHT AWAY IF:  Your fingers are numb or tingling.  Your fingers turn blue or feel cold to the touch.  You cannot control the bleeding from your injury.  You are short of breath.   This information is not intended to replace advice given to you by your health care provider. Make sure you discuss any questions you have with your health care provider.   Document Released: 09/28/2009 Document Revised: 07/25/2014 Document Reviewed: 05/07/2014 Elsevier Interactive Patient Education [employer]

## 2015-12-11 NOTE — ED Provider Notes (Signed)
CSN: [id number]     Arrival date & time 12/11/15  1227 History  By signing my name below, I, Placido Sou, attest that this documentation has been prepared under the direction and in the presence of Fayrene Helper, PA-C. Electronically Signed: Placido Sou, ED Scribe. 12/11/2015. 1:58 PM.    Chief Complaint  Patient presents with  . Fall  . Shoulder Pain   The history is provided by the patient. No language interpreter was used.    HPI Comments: Kiara [name] is a 55 y.o. female who is right hand dominant presents to the Emergency Department by EMS complaining of a fall that occurred PTA. She was in a kitchen at work and slipped on a wet surface and while falling attempted to catch herself on a table with her LUE resulting in a "pop" in her left shoulder before ultimately landing on her left shoulder. She reports associated, constant, moderate, left shoulder pain which radiates to her left elbow and mild paraesthesia which radiates down her LUE into all five digits of her left hand. She additionally reports mild left elbow and hip pain. Her shoulder pain worsens with any movement of her left shoulder. She denies LOC, head trauma and numbness.   Past Medical History  Diagnosis Date  . GERD (gastroesophageal reflux disease)   . OCD (obsessive compulsive disorder)   . Fatty liver    Past Surgical History  Procedure Laterality Date  . Appendectomy    . Ovarian cyst removal    . Abdominal hysterectomy    . Cholecystectomy     Family History  Problem Relation Age of Onset  . Heart attack Mother   . Heart attack Father   . Lung cancer Mother   . Thrombocytopenia Sister     ITP   Social History  Substance Use Topics  . Smoking status: Never Smoker   . Smokeless tobacco: Never Used  . Alcohol Use: No   OB History    No data available     Review of Systems  Musculoskeletal: Positive for myalgias and arthralgias.  Skin: Negative for wound.  Neurological: Negative for syncope  and numbness.   Allergies  Cinnamon; Codeine; Erythromycin; and Sulfamethoxazole  Home Medications   Prior to Admission medications   Medication Sig Start Date End Date Taking? Authorizing Provider  clindamycin (CLEOCIN) 150 MG capsule Take 150 mg by mouth 3 (three) times daily.    Historical Provider, MD  estradiol (ESTRACE) 1 MG tablet Take 1 mg by mouth daily.    Historical Provider, MD  fluvoxaMINE (LUVOX) 100 MG tablet Take 300 mg by mouth at bedtime.     Historical Provider, MD  hydrOXYzine (VISTARIL) 25 MG capsule Take 1 capsule (25 mg total) by mouth 3 (three) times daily as needed. 04/19/15   Eber Hong, MD  levothyroxine (SYNTHROID, LEVOTHROID) 25 MCG tablet Take 25 mcg by mouth daily before breakfast.    Historical Provider, MD  omeprazole (PRILOSEC) 40 MG capsule Take 40 mg by mouth 2 (two) times daily as needed (for acid reflux and heartburn).    Historical Provider, MD  Progesterone Micronized (PROGESTERONE PO) Take by mouth.    Historical Provider, MD   BP 166/97 mmHg  Pulse 58  Temp(Src) 98.3 F (36.8 C) (Oral)  Resp 18  SpO2 100%    Physical Exam  Constitutional: She is oriented to person, place, and time. She appears well-developed and well-nourished.  Caucasian female sitting upright with a sling over her left shoulder  HENT:  Head: Normocephalic and atraumatic.  Eyes: EOM are normal.  Neck: Normal range of motion.  Cardiovascular: Normal rate.   Pulmonary/Chest: Effort normal. No respiratory distress.  Abdominal: Soft.  Musculoskeletal: Normal range of motion. She exhibits tenderness.  TTP directly over the left humeral head, limited ROM 2/2 pain; no tenderness with movement of left elbow or wrist; LLE pain to palpation of lateral aspect of left hip; FROM of her left hip, knee and ankle without pain; nml gait  Neurological: She is alert and oriented to person, place, and time.  sharp/soft sensation intact to LUE and all digits of the left hand with   Skin:  Skin is warm and dry. She is not diaphoretic.  Psychiatric: She has a normal mood and affect.  Nursing note and vitals reviewed.   ED Course  Procedures  DIAGNOSTIC STUDIES: Oxygen Saturation is 100% on RA, normal by my interpretation.    COORDINATION OF CARE: 1:55 PM Pt has a non displaced L humeral neck fracture.  She initially report tingling sensation down her L arm.  However, on exam she has intact sensation throughout L arm.  Radial pulse 2+. Doubt axillary nerve injury.  Perhaps mild neuropraxia.  Discussed next steps with pt including a left shoulder sling, pain medication and a referral to an orthopaedist. Return precautions noted. Pt verbalized understanding and is agreeable with the plan.   Imaging Review Dg Shoulder Left  12/11/2015  CLINICAL DATA:  Fall at work today with severe left shoulder pain. Initial encounter. EXAM: LEFT SHOULDER - 2+ VIEW COMPARISON:  None. FINDINGS: Acute fracture of the surgical neck left humerus with minor impaction. No visible tuberosity or head involvement. A fracture line continues proximally toward the shaft, nondisplaced. No dislocation or separation. IMPRESSION: Nondisplaced surgical neck fracture. Electronically Signed   By: Marnee SpringJonathon  Watts M.D.   On: 12/11/2015 13:02   I have personally reviewed and evaluated these images as part of my medical decision-making.  MDM   Final diagnoses:  Shoulder fracture, left, closed, initial encounter    BP 166/97 mmHg  Pulse 58  Temp(Src) 98.3 F (36.8 C) (Oral)  Resp 18  SpO2 100%   I personally performed the services described in this documentation, which was scribed in my presence. The recorded information has been reviewed and is accurate.      Fayrene HelperBowie Jowel Waltner, PA-C 12/11/15 1405  Nelva Nayobert Beaton, MD 12/13/15 [phone]

## 2015-12-11 NOTE — ED Notes (Signed)
PT DISCHARGED. INSTRUCTIONS AND PRESCRIPTION GIVEN. AAOX4. PT IN NO APPARENT DISTRESS. THE OPPORTUNITY TO ASK QUESTIONS WAS PROVIDED. 

## 2017-04-16 IMAGING — CT CT HEAD W/O CM
1 series · 15 of 30 positions shown, 19 images · non-contrast
Comparison: CT of the head performed 02/14/2008, and MRI of the
brain performed 07/20/2014

CLINICAL DATA: Acute onset of syncope. High blood pressure and
bifrontal headache. Blurred vision and dizziness. Initial encounter.

EXAM:
CT HEAD WITHOUT CONTRAST
TECHNIQUE: Contiguous axial images were obtained from the base of the skull
through the vertex without intravenous contrast.

[Series 2: head wo · axial · 0.45mm/px · z∈[-161,-16]mm · 15 of 33 slices shown, 19 images]
[im 2/33  brain]
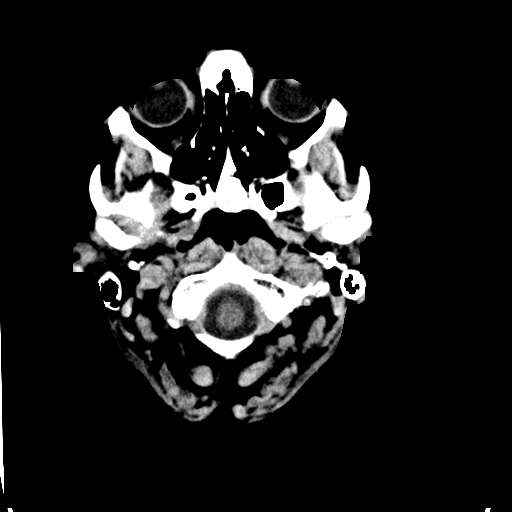
[im 2/33  bone]
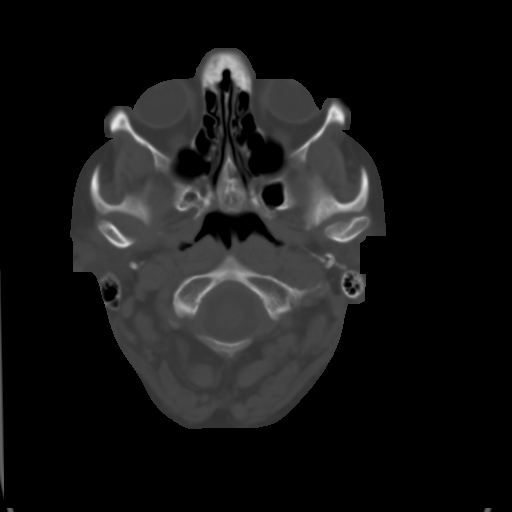
[im 4/33  brain]
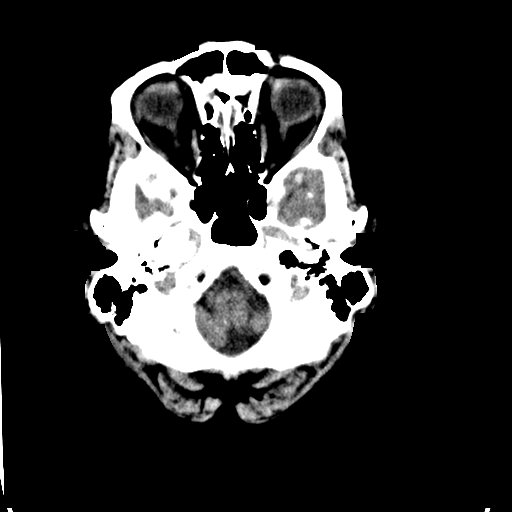
[im 6/33  brain]
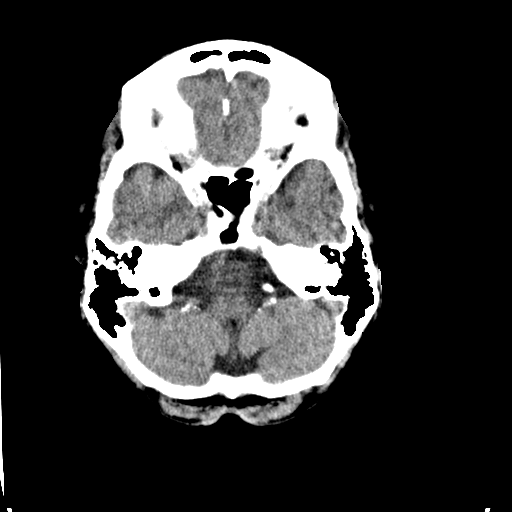
[im 8/33  brain]
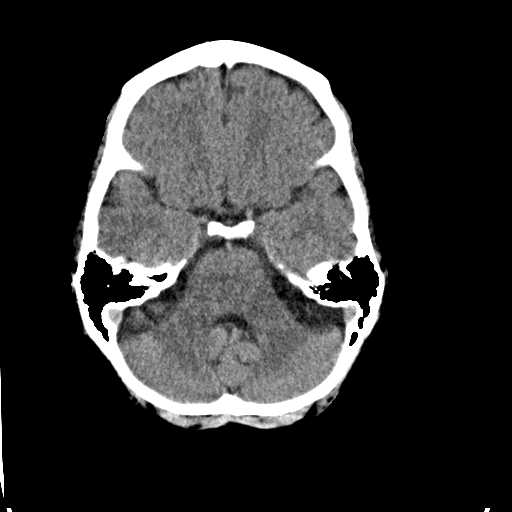
[im 10/33  brain]
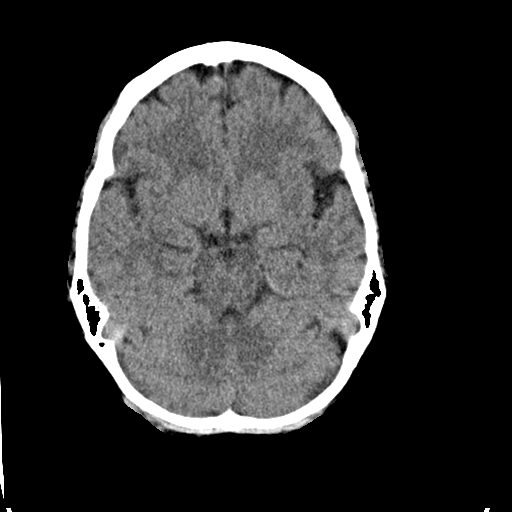
[im 10/33  bone]
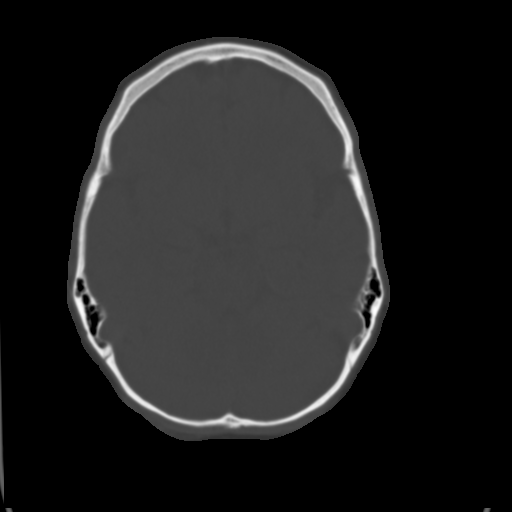
[im 13/33  brain]
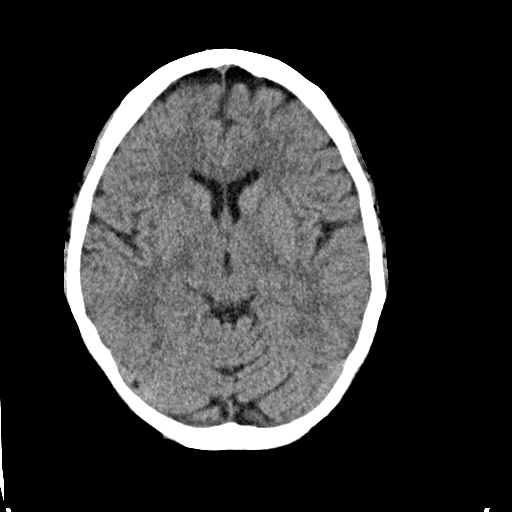
[im 15/33  brain]
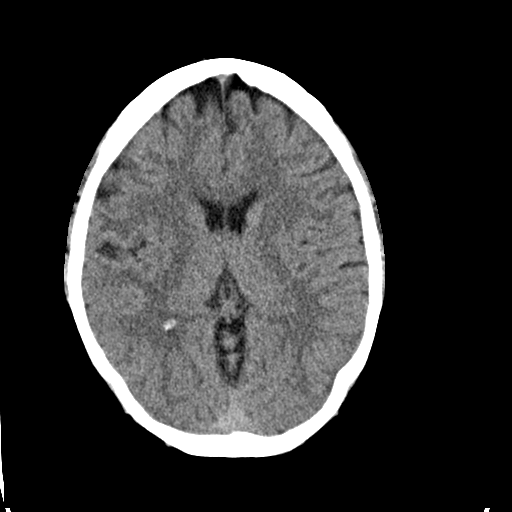
[im 17/33  brain]
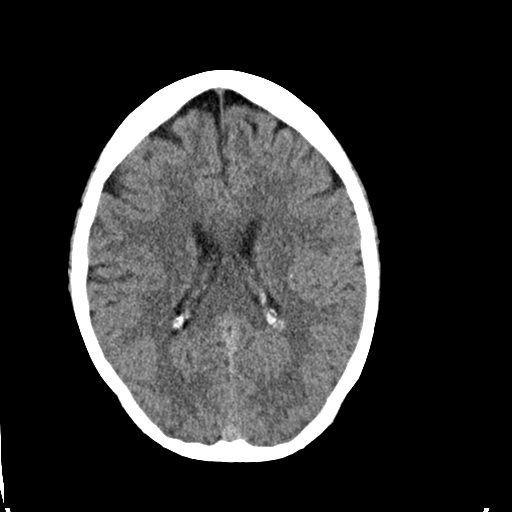
[im 18/33  brain]
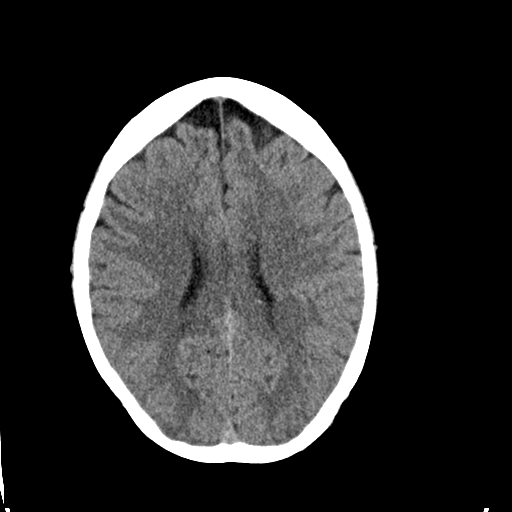
[im 18/33  bone]
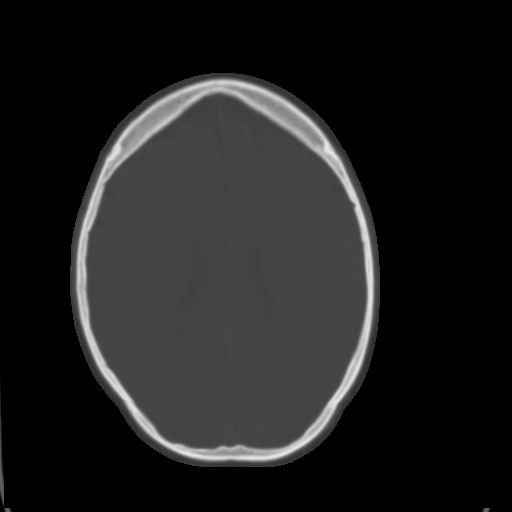
[im 20/33  brain]
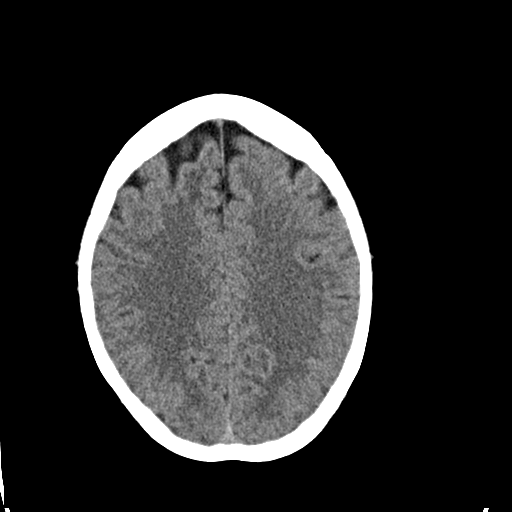
[im 23/33  brain]
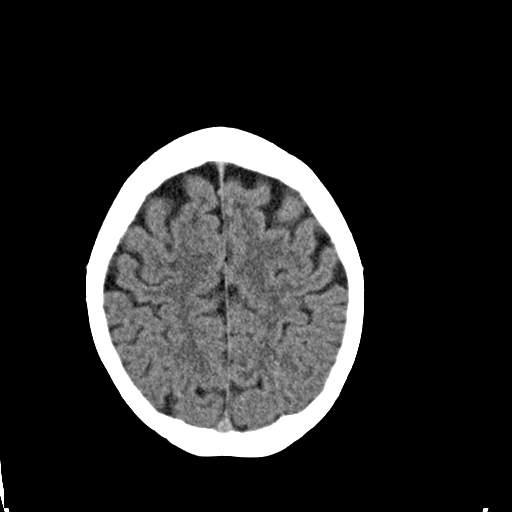
[im 25/33  brain]
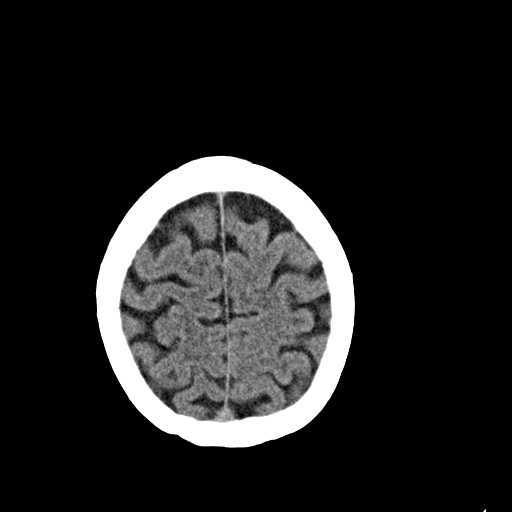
[im 27/33  brain]
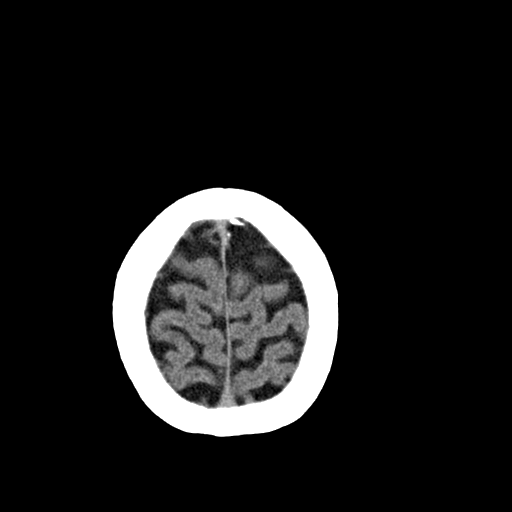
[im 27/33  bone]
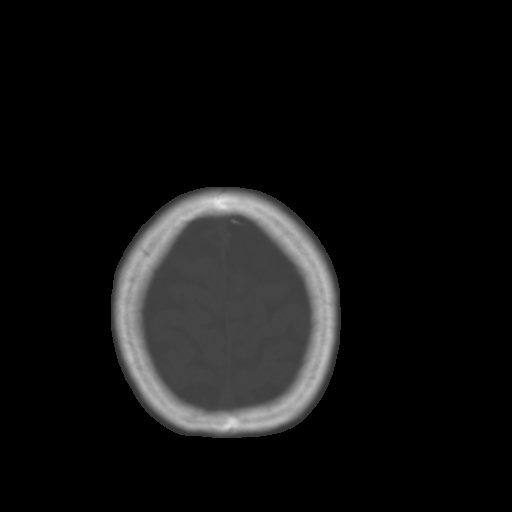
[im 29/33  brain]
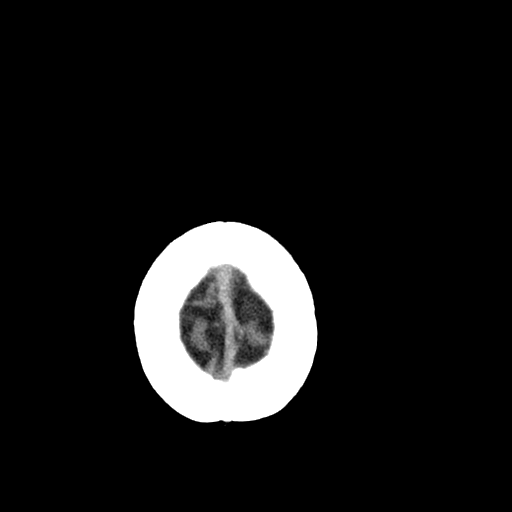
[im 31/33  brain]
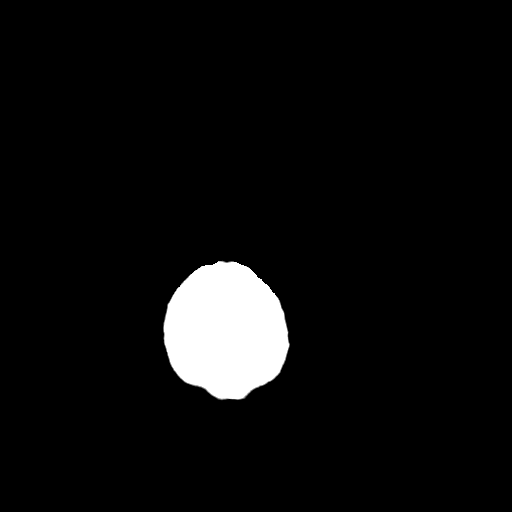

[15 of 30 positions shown; findings below may reference images not displayed]

FINDINGS: There is no evidence of acute infarction, mass lesion, or intra- or
extra-axial hemorrhage on CT.

Mildly prominent Virchow-Robin spaces are suggested at the basal
ganglia bilaterally.

The posterior fossa, including the cerebellum, brainstem and fourth
ventricle, is within normal limits. The third and lateral
ventricles, and basal ganglia are unremarkable in appearance. The
cerebral hemispheres are symmetric in appearance, with normal
gray-white differentiation. No mass effect or midline shift is seen.

There is no evidence of fracture; visualized osseous structures are
unremarkable in appearance. The visualized portions of the orbits
are within normal limits. The paranasal sinuses and mastoid air
cells are well-aerated. No significant soft tissue abnormalities are
seen.
IMPRESSION: Unremarkable noncontrast CT of the head.

## 2017-09-07 IMAGING — CR DG SHOULDER 2+V*L*
3 series · 3 of 3 positions shown · non-contrast
Comparison: None.

CLINICAL DATA: Fall at work today with severe left shoulder pain.
Initial encounter.

EXAM:
LEFT SHOULDER - 2+ VIEW

[w shoulder external left (1 of 2)]
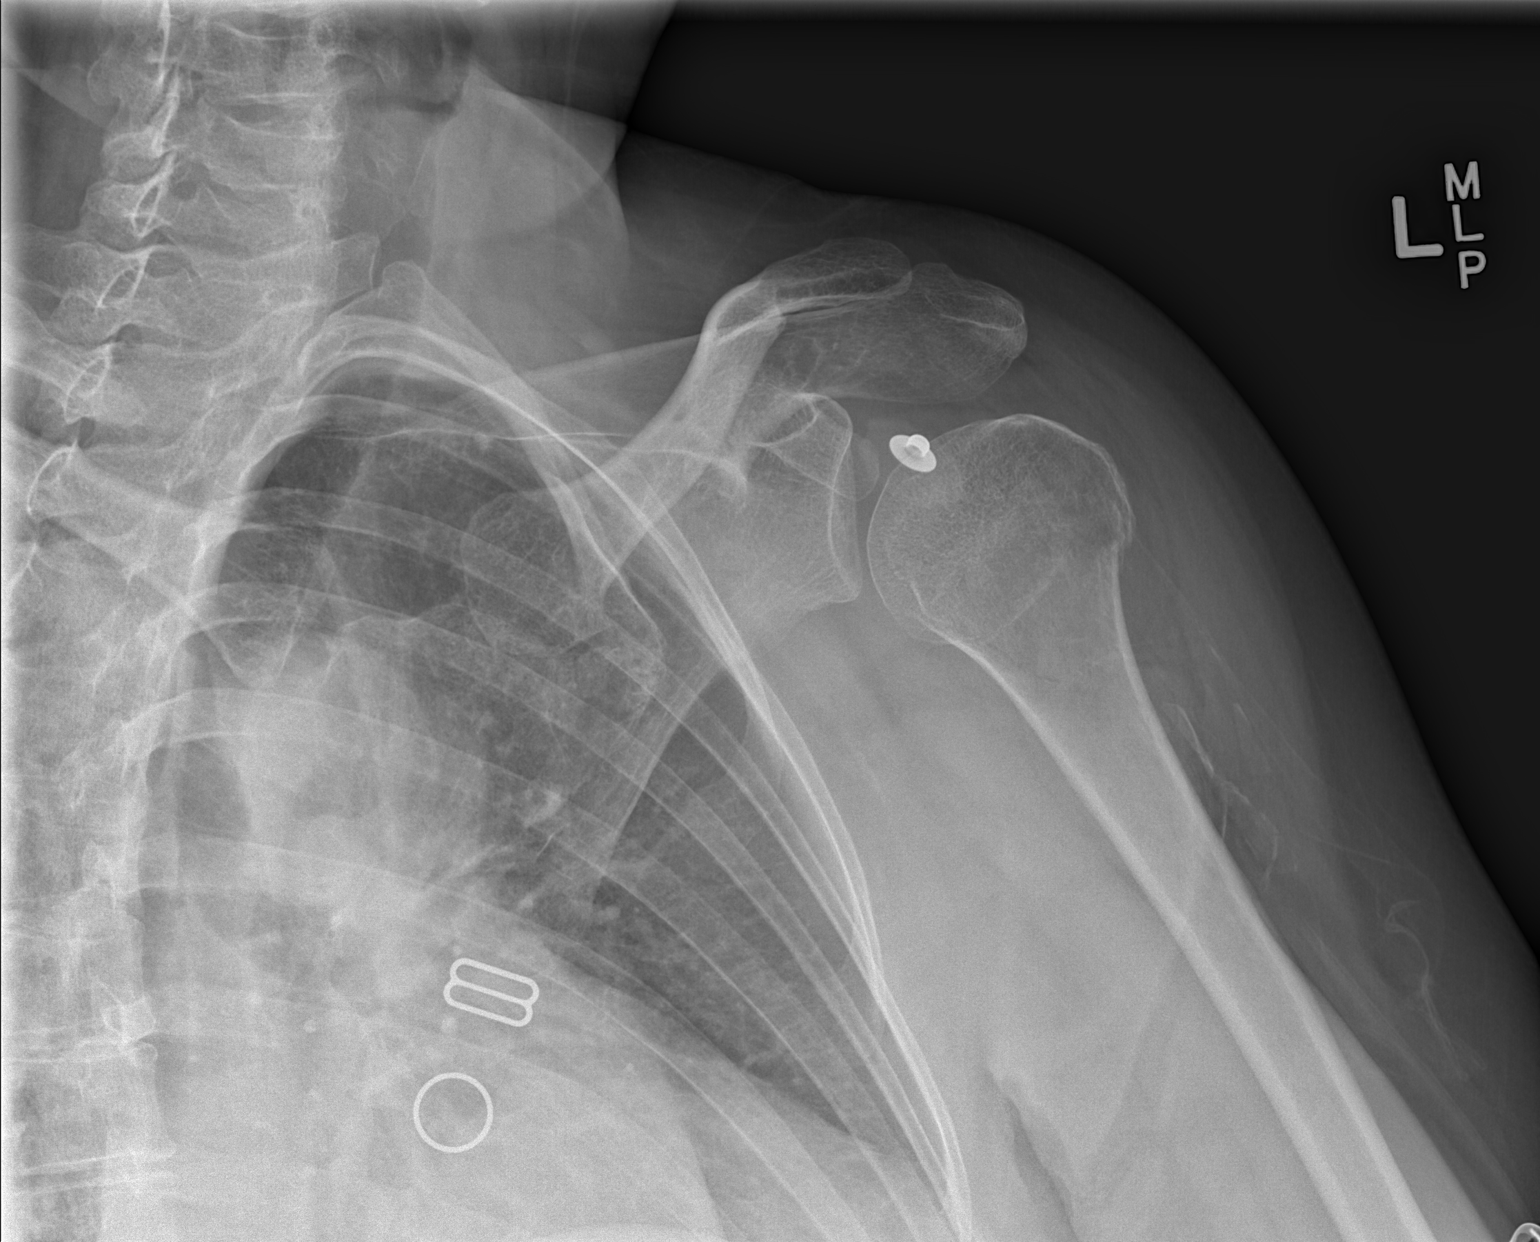

[w shoulder external left (2 of 2)]
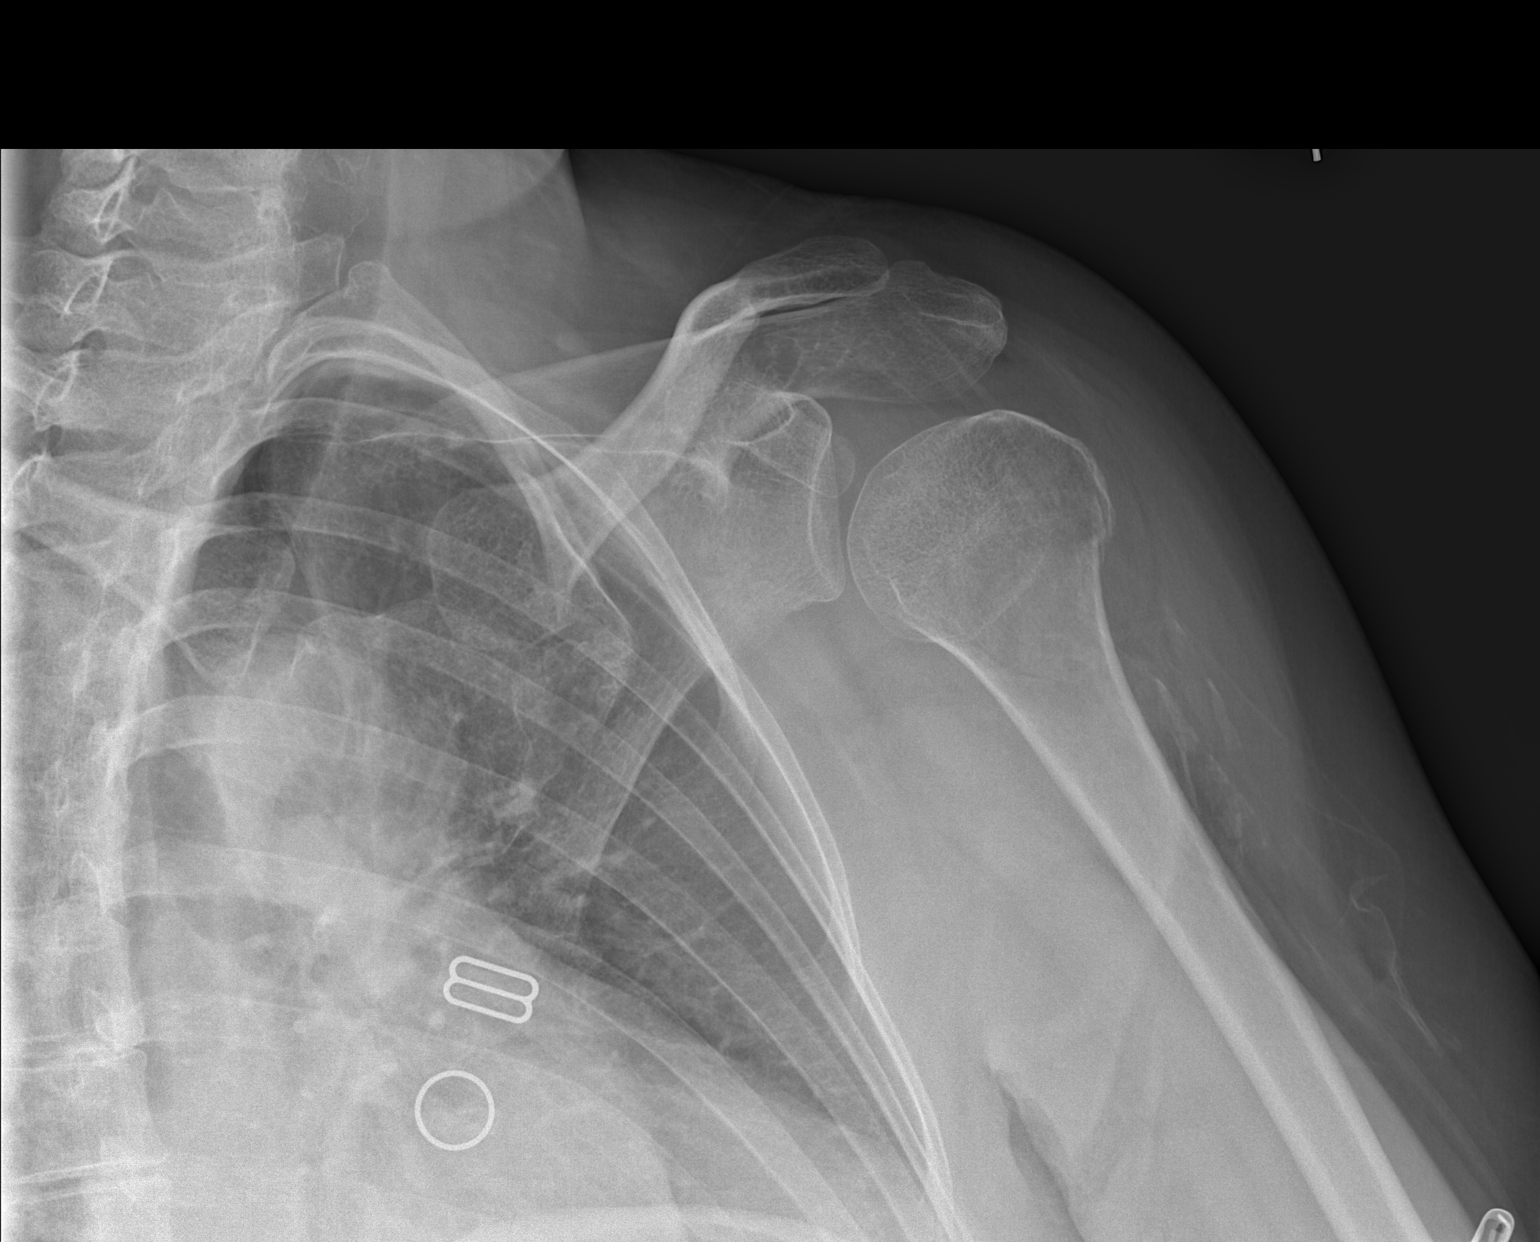

[w shoulder y-view left]
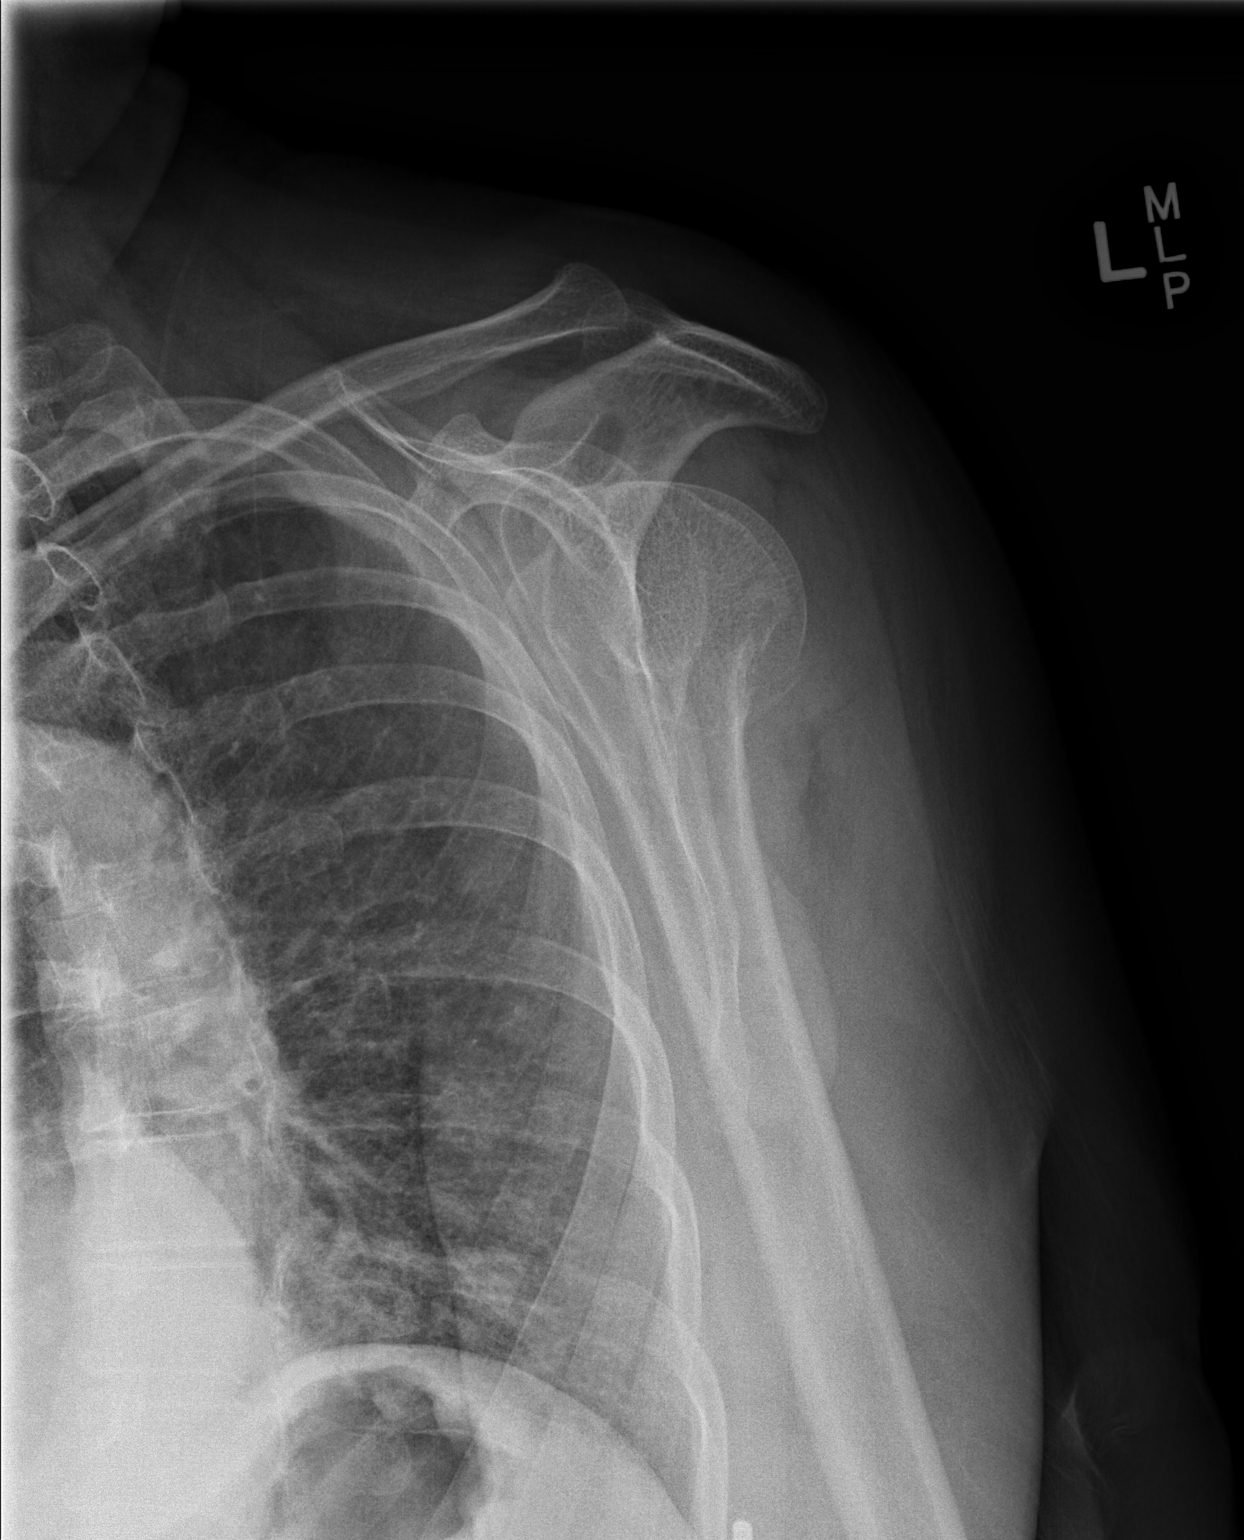

[3 of 3 positions shown; findings below may reference images not displayed]

FINDINGS: Acute fracture of the surgical neck left humerus with minor
impaction. No visible tuberosity or head involvement. A fracture
line continues proximally toward the shaft, nondisplaced. No
dislocation or separation.
IMPRESSION: Nondisplaced surgical neck fracture.

## 2019-01-30 ENCOUNTER — Other Ambulatory Visit: Payer: Self-pay | Admitting: *Deleted

## 2019-01-30 DIAGNOSIS — Z20822 Contact with and (suspected) exposure to covid-19: Secondary | ICD-10-CM

## 2019-02-03 LAB — NOVEL CORONAVIRUS, NAA: SARS-CoV-2, NAA: NOT DETECTED
